# Patient Record
Sex: Female | Born: 1945
Health system: Southern US, Community
[De-identification: ages and names within clinical notes are randomized; demographics above are authoritative.]

## PROBLEM LIST (undated history)

## (undated) DIAGNOSIS — E785 Hyperlipidemia, unspecified: Secondary | ICD-10-CM

## (undated) DIAGNOSIS — C801 Malignant (primary) neoplasm, unspecified: Secondary | ICD-10-CM

## (undated) DIAGNOSIS — M8580 Other specified disorders of bone density and structure, unspecified site: Secondary | ICD-10-CM

## (undated) DIAGNOSIS — I10 Essential (primary) hypertension: Secondary | ICD-10-CM

## (undated) DIAGNOSIS — R7303 Prediabetes: Secondary | ICD-10-CM

## (undated) DIAGNOSIS — E559 Vitamin D deficiency, unspecified: Secondary | ICD-10-CM

## (undated) HISTORY — DX: Prediabetes: R73.03

## (undated) HISTORY — DX: Essential (primary) hypertension: I10

## (undated) HISTORY — DX: Other specified disorders of bone density and structure, unspecified site: M85.80

## (undated) HISTORY — DX: Hyperlipidemia, unspecified: E78.5

## (undated) HISTORY — PX: DILATION AND CURETTAGE OF UTERUS: SHX78

## (undated) HISTORY — DX: Malignant (primary) neoplasm, unspecified: C80.1

## (undated) HISTORY — DX: Vitamin D deficiency, unspecified: E55.9

## (undated) HISTORY — DX: Hypomagnesemia: E83.42

## (undated) HISTORY — PX: TONSILLECTOMY AND ADENOIDECTOMY: SHX28

---

## 1995-03-29 HISTORY — PX: BREAST LUMPECTOMY: SHX2

## 1997-09-25 ENCOUNTER — Other Ambulatory Visit: Admission: RE | Admit: 1997-09-25 | Discharge: 1997-09-25 | Payer: Self-pay | Admitting: *Deleted

## 1997-11-29 ENCOUNTER — Ambulatory Visit (HOSPITAL_COMMUNITY): Admission: RE | Admit: 1997-11-29 | Discharge: 1997-11-29 | Payer: Self-pay | Admitting: Gastroenterology

## 1998-06-30 ENCOUNTER — Ambulatory Visit (HOSPITAL_COMMUNITY): Admission: RE | Admit: 1998-06-30 | Discharge: 1998-06-30 | Payer: Self-pay | Admitting: *Deleted

## 1999-04-07 ENCOUNTER — Other Ambulatory Visit: Admission: RE | Admit: 1999-04-07 | Discharge: 1999-04-07 | Payer: Self-pay | Admitting: *Deleted

## 2000-06-09 ENCOUNTER — Other Ambulatory Visit: Admission: RE | Admit: 2000-06-09 | Discharge: 2000-06-09 | Payer: Self-pay | Admitting: *Deleted

## 2001-06-12 ENCOUNTER — Other Ambulatory Visit: Admission: RE | Admit: 2001-06-12 | Discharge: 2001-06-12 | Payer: Self-pay | Admitting: *Deleted

## 2002-06-29 ENCOUNTER — Other Ambulatory Visit: Admission: RE | Admit: 2002-06-29 | Discharge: 2002-06-29 | Payer: Self-pay | Admitting: *Deleted

## 2003-03-05 ENCOUNTER — Encounter (INDEPENDENT_AMBULATORY_CARE_PROVIDER_SITE_OTHER): Payer: Self-pay | Admitting: Specialist

## 2003-03-05 ENCOUNTER — Ambulatory Visit (HOSPITAL_COMMUNITY): Admission: RE | Admit: 2003-03-05 | Discharge: 2003-03-05 | Payer: Self-pay | Admitting: Gastroenterology

## 2003-07-26 ENCOUNTER — Other Ambulatory Visit: Admission: RE | Admit: 2003-07-26 | Discharge: 2003-07-26 | Payer: Self-pay | Admitting: *Deleted

## 2007-07-28 ENCOUNTER — Ambulatory Visit (HOSPITAL_COMMUNITY): Admission: RE | Admit: 2007-07-28 | Discharge: 2007-07-28 | Payer: Self-pay | Admitting: Internal Medicine

## 2010-11-13 ENCOUNTER — Encounter (INDEPENDENT_AMBULATORY_CARE_PROVIDER_SITE_OTHER): Payer: Self-pay | Admitting: General Surgery

## 2010-11-13 NOTE — Op Note (Signed)
Christina Rowe, Christina Rowe                           ACCOUNT NO.:  [id number]   MEDICAL RECORD NO.:  [id number]                   PATIENT TYPE:  AMB   LOCATION:  ENDO                                 FACILITY:  [facility]   PHYSICIAN:  Petra Kuba, M.D.                 DATE OF BIRTH:  [birth year]   DATE OF PROCEDURE:  03/05/2003  DATE OF DISCHARGE:                                 OPERATIVE REPORT   PROCEDURE PERFORMED:  Colonoscopy with biopsy.   ENDOSCOPIST:  Petra Kuba, M.D.   INDICATIONS FOR PROCEDURE:  Patient with family history of colon cancer, due  for colonic screening.  Consent was signed after the risks, benefits,  methods and options were thoroughly discussed in the past.   MEDICINES USED:  Demerol 80 mg, Versed 9 mg.   DESCRIPTION OF PROCEDURE:  Rectal inspection was pertinent for external  hemorrhoids, small.  Digital exam was negative.  A video pediatric  adjustable colonoscope was inserted and easily advanced to the splenic  flexure.  At that point there was a significant turn and some looping and  with rolling her on her back and some abdominal pressure, we were able to  advance through this area.  Once through this area, we were easily able to  advance to the cecum.  No abnormalities were seen on insertion.  The cecum  was identified by the appendiceal orifice and the ileocecal valve.  The  scope was slowly withdrawn.  The prep on the right side was not as good as  the left side.  It did require a moderate amount of  washing and suctioning.  There was some stool adherent to the wall of the colon. The rest of the prep  was adequate.  On slow withdrawal through the colon, the cecum, ascending  and transverse were normal.  At the approximate level of the splenic  flexure, possibly a tiny hyperplastic-appearing polyp was seen and was cold  biopsied times two.  The scope was further withdrawn.  There was an  occasional left-sided diverticulum. There was another questionable  tiny,  hyperplastic appearing mid-sigmoid polyp which was cold biopsied and put in  a separate container.  The scope was withdrawn back to the rectum.  Anorectal pullthrough and retroflexion confirmed some small hemorrhoids.  Scope was straightened and readvanced a short ways up the left side of the  colon.  Air was suctioned, scope removed.  The patient tolerated the  procedure well.  There was no immediate obvious complication.   ENDOSCOPIC DIAGNOSIS:  1. Tiny internal and external hemorrhoids.  2. Left-sided occasional diverticula.  3. Questionable two tiny sigmoid and splenic flexure probable hyperplastic     appearing polyps, cold biopsied.  4. Otherwise within normal limits to the cecum.    PLAN:  Await pathology.  Probably recheck colon screening in five years.  Happy to see back p.r.n.  Otherwise return care to Drs. Elisabeth Most and  Carey Bullocks for the customary health care maintenance to include yearly rectals  and guaiacs.                                               Petra Kuba, M.D.    MEM/MEDQ  D:  03/05/2003  T:  03/05/2003  Job:  [id number]   cc:   Lovenia Kim, D.O.  [address]  [place]  [place] [address]  Fax: [phone]   Pershing Cox, M.D.  [address]  [address]  [place]  [place] [address]  Fax: [phone]

## 2011-04-26 ENCOUNTER — Encounter (INDEPENDENT_AMBULATORY_CARE_PROVIDER_SITE_OTHER): Payer: Self-pay | Admitting: General Surgery

## 2011-06-03 ENCOUNTER — Ambulatory Visit (INDEPENDENT_AMBULATORY_CARE_PROVIDER_SITE_OTHER): Payer: Self-pay | Admitting: General Surgery

## 2011-06-03 ENCOUNTER — Encounter (INDEPENDENT_AMBULATORY_CARE_PROVIDER_SITE_OTHER): Payer: Self-pay | Admitting: General Surgery

## 2011-06-07 ENCOUNTER — Ambulatory Visit (INDEPENDENT_AMBULATORY_CARE_PROVIDER_SITE_OTHER): Payer: Medicare Other | Admitting: General Surgery

## 2011-06-07 ENCOUNTER — Encounter (INDEPENDENT_AMBULATORY_CARE_PROVIDER_SITE_OTHER): Payer: Self-pay | Admitting: General Surgery

## 2011-06-07 VITALS — BP 106/64 | HR 60 | Temp 97.0°F | Resp 16 | Ht 61.0 in | Wt 123.2 lb

## 2011-06-07 DIAGNOSIS — Z853 Personal history of malignant neoplasm of breast: Secondary | ICD-10-CM | POA: Insufficient documentation

## 2011-06-07 DIAGNOSIS — C50919 Malignant neoplasm of unspecified site of unspecified female breast: Secondary | ICD-10-CM

## 2011-06-07 NOTE — Patient Instructions (Signed)
Continue regular self exams  

## 2011-06-07 NOTE — Progress Notes (Signed)
Subjective:     Patient ID: Christina Rowe, female   DOB: June 14, 1946, 65 y.o.   MRN: [id number]  HPI The patient is a 65 year old white female who is now 15-1/2 years out from a right breast lumpectomy and axillary node dissection for a stage II right breast cancer. She underwent radiation and hormonal therapy. She is doing very well. She has no complaints today. She's not having any chest wall pain or breast pain. She does not have any discharge from the nipple.  Review of Systems  Constitutional: Negative.   HENT: Negative.   Eyes: Negative.   Respiratory: Negative.   Cardiovascular: Negative.   Gastrointestinal: Negative.   Genitourinary: Negative.   Musculoskeletal: Negative.   Skin: Negative.   Neurological: Negative.   Hematological: Negative.   Psychiatric/Behavioral: Negative.        Objective:   Physical Exam  Constitutional: She is oriented to person, place, and time. She appears well-developed and well-nourished.  HENT:  Head: Normocephalic and atraumatic.  Eyes: Conjunctivae and EOM are normal. Pupils are equal, round, and reactive to light.  Neck: Normal range of motion. Neck supple.  Cardiovascular: Normal rate, regular rhythm and normal heart sounds.   Pulmonary/Chest: Effort normal and breath sounds normal.       No palpable mass in either breast. No axillary supraclavicular or cervical lymphadenopathy.  Abdominal: Soft. Bowel sounds are normal. She exhibits no mass. There is no tenderness.  Musculoskeletal: Normal range of motion.  Lymphadenopathy:    She has no cervical adenopathy.  Neurological: She is alert and oriented to person, place, and time.  Skin: Skin is warm and dry.  Psychiatric: She has a normal mood and affect. Her behavior is normal.       Assessment:     15-1/2 years out from a right lumpectomy and axillary node dissection    Plan:     Her last mammogram done this year was at Dr. Loraine Maple office and is reported as negative. I have  encouraged her to continue regular self exams. We will plan to see her back in another year.

## 2011-06-11 ENCOUNTER — Encounter (INDEPENDENT_AMBULATORY_CARE_PROVIDER_SITE_OTHER): Payer: Self-pay

## 2011-08-09 DIAGNOSIS — H2510 Age-related nuclear cataract, unspecified eye: Secondary | ICD-10-CM | POA: Diagnosis not present

## 2011-08-12 DIAGNOSIS — Z1231 Encounter for screening mammogram for malignant neoplasm of breast: Secondary | ICD-10-CM | POA: Diagnosis not present

## 2011-08-30 ENCOUNTER — Encounter (INDEPENDENT_AMBULATORY_CARE_PROVIDER_SITE_OTHER): Payer: Self-pay | Admitting: General Surgery

## 2012-01-12 DIAGNOSIS — M899 Disorder of bone, unspecified: Secondary | ICD-10-CM | POA: Diagnosis not present

## 2012-01-12 DIAGNOSIS — M949 Disorder of cartilage, unspecified: Secondary | ICD-10-CM | POA: Diagnosis not present

## 2012-01-20 DIAGNOSIS — Z79899 Other long term (current) drug therapy: Secondary | ICD-10-CM | POA: Diagnosis not present

## 2012-01-20 DIAGNOSIS — E559 Vitamin D deficiency, unspecified: Secondary | ICD-10-CM | POA: Diagnosis not present

## 2012-01-20 DIAGNOSIS — E782 Mixed hyperlipidemia: Secondary | ICD-10-CM | POA: Diagnosis not present

## 2012-01-20 DIAGNOSIS — Z1212 Encounter for screening for malignant neoplasm of rectum: Secondary | ICD-10-CM | POA: Diagnosis not present

## 2012-01-20 DIAGNOSIS — I10 Essential (primary) hypertension: Secondary | ICD-10-CM | POA: Diagnosis not present

## 2012-04-06 DIAGNOSIS — Z01419 Encounter for gynecological examination (general) (routine) without abnormal findings: Secondary | ICD-10-CM | POA: Diagnosis not present

## 2012-04-06 DIAGNOSIS — M899 Disorder of bone, unspecified: Secondary | ICD-10-CM | POA: Diagnosis not present

## 2012-04-06 DIAGNOSIS — N951 Menopausal and female climacteric states: Secondary | ICD-10-CM | POA: Diagnosis not present

## 2012-04-06 DIAGNOSIS — Z124 Encounter for screening for malignant neoplasm of cervix: Secondary | ICD-10-CM | POA: Diagnosis not present

## 2012-04-06 DIAGNOSIS — M949 Disorder of cartilage, unspecified: Secondary | ICD-10-CM | POA: Diagnosis not present

## 2012-04-20 DIAGNOSIS — Z23 Encounter for immunization: Secondary | ICD-10-CM | POA: Diagnosis not present

## 2012-08-08 DIAGNOSIS — Z79899 Other long term (current) drug therapy: Secondary | ICD-10-CM | POA: Diagnosis not present

## 2012-08-08 DIAGNOSIS — I10 Essential (primary) hypertension: Secondary | ICD-10-CM | POA: Diagnosis not present

## 2012-08-08 DIAGNOSIS — R7309 Other abnormal glucose: Secondary | ICD-10-CM | POA: Diagnosis not present

## 2012-08-08 DIAGNOSIS — E559 Vitamin D deficiency, unspecified: Secondary | ICD-10-CM | POA: Diagnosis not present

## 2012-08-08 DIAGNOSIS — E782 Mixed hyperlipidemia: Secondary | ICD-10-CM | POA: Diagnosis not present

## 2012-08-17 ENCOUNTER — Telehealth (INDEPENDENT_AMBULATORY_CARE_PROVIDER_SITE_OTHER): Payer: Self-pay

## 2012-08-17 DIAGNOSIS — Z1231 Encounter for screening mammogram for malignant neoplasm of breast: Secondary | ICD-10-CM | POA: Diagnosis not present

## 2012-08-17 NOTE — Telephone Encounter (Signed)
LMOM to call back. She has appt on 2/24 and I don't have recent MM report. If she has had one I need a copy faxed over or she needs to bring one with her.

## 2012-08-18 NOTE — Telephone Encounter (Signed)
The pt called back.  She just had her mammogram yesterday at [facility].  The report is in your box.

## 2012-08-21 ENCOUNTER — Ambulatory Visit (INDEPENDENT_AMBULATORY_CARE_PROVIDER_SITE_OTHER): Payer: Medicare Other | Admitting: General Surgery

## 2012-08-21 ENCOUNTER — Encounter (INDEPENDENT_AMBULATORY_CARE_PROVIDER_SITE_OTHER): Payer: Self-pay | Admitting: General Surgery

## 2012-08-21 VITALS — BP 118/60 | HR 92 | Temp 97.7°F | Resp 18 | Ht 60.0 in | Wt 124.4 lb

## 2012-08-21 DIAGNOSIS — C50919 Malignant neoplasm of unspecified site of unspecified female breast: Secondary | ICD-10-CM

## 2012-08-21 DIAGNOSIS — C50911 Malignant neoplasm of unspecified site of right female breast: Secondary | ICD-10-CM

## 2012-08-21 NOTE — Patient Instructions (Signed)
Continue regular self exams  

## 2012-08-21 NOTE — Progress Notes (Signed)
Subjective:     Patient ID: Christina Rowe, female   DOB: [birth year], 67 y.o.   MRN: [id number]  HPI The patient is a 67 year old white female who is 16-1/2 years out from a right lumpectomy and axillary lymph node dissection for breast cancer. Since her last visit she has been well. She denies any major medical problems. She denies any breast pain. She denies any swelling in her right arm. Her most recent mammogram in the last couple weeks showed no evidence of malignancy.  Review of Systems  Constitutional: Negative.   HENT: Negative.   Eyes: Negative.   Respiratory: Negative.   Cardiovascular: Negative.   Gastrointestinal: Negative.   Endocrine: Negative.   Genitourinary: Negative.   Musculoskeletal: Negative.   Skin: Negative.   Allergic/Immunologic: Negative.   Neurological: Negative.   Hematological: Negative.   Psychiatric/Behavioral: Negative.        Objective:   Physical Exam  Constitutional: She is oriented to person, place, and time. She appears well-developed and well-nourished.  HENT:  Head: Normocephalic and atraumatic.  Eyes: Conjunctivae and EOM are normal. Pupils are equal, round, and reactive to light.  Neck: Normal range of motion. Neck supple.  Cardiovascular: Normal rate, regular rhythm and normal heart sounds.   Pulmonary/Chest: Effort normal and breath sounds normal.  There is no palpable mass in either breast. There is no palpable axillary or supraclavicular cervical lymphadenopathy.  Abdominal: Soft. Bowel sounds are normal. She exhibits no mass. There is no tenderness.  Musculoskeletal: Normal range of motion.  Lymphadenopathy:    She has no cervical adenopathy.  Neurological: She is alert and oriented to person, place, and time.  Skin: Skin is warm and dry.  Psychiatric: She has a normal mood and affect. Her behavior is normal.       Assessment:     The patient is 16-1/2 years out from a right lumpectomy and axillary lymph node dissection for  breast cancer     Plan:     At this point she will continue to do regular self exams. We will plan to see her back in one year

## 2012-09-28 ENCOUNTER — Encounter (INDEPENDENT_AMBULATORY_CARE_PROVIDER_SITE_OTHER): Payer: Self-pay

## 2012-10-19 DIAGNOSIS — H2510 Age-related nuclear cataract, unspecified eye: Secondary | ICD-10-CM | POA: Diagnosis not present

## 2012-11-15 DIAGNOSIS — E782 Mixed hyperlipidemia: Secondary | ICD-10-CM | POA: Diagnosis not present

## 2012-11-15 DIAGNOSIS — Z79899 Other long term (current) drug therapy: Secondary | ICD-10-CM | POA: Diagnosis not present

## 2012-11-15 DIAGNOSIS — I10 Essential (primary) hypertension: Secondary | ICD-10-CM | POA: Diagnosis not present

## 2012-11-15 DIAGNOSIS — E559 Vitamin D deficiency, unspecified: Secondary | ICD-10-CM | POA: Diagnosis not present

## 2012-11-15 DIAGNOSIS — R7309 Other abnormal glucose: Secondary | ICD-10-CM | POA: Diagnosis not present

## 2013-02-01 DIAGNOSIS — E559 Vitamin D deficiency, unspecified: Secondary | ICD-10-CM | POA: Diagnosis not present

## 2013-02-01 DIAGNOSIS — E782 Mixed hyperlipidemia: Secondary | ICD-10-CM | POA: Diagnosis not present

## 2013-02-01 DIAGNOSIS — Z79899 Other long term (current) drug therapy: Secondary | ICD-10-CM | POA: Diagnosis not present

## 2013-02-01 DIAGNOSIS — Z1212 Encounter for screening for malignant neoplasm of rectum: Secondary | ICD-10-CM | POA: Diagnosis not present

## 2013-02-01 DIAGNOSIS — I10 Essential (primary) hypertension: Secondary | ICD-10-CM | POA: Diagnosis not present

## 2013-02-28 DIAGNOSIS — D485 Neoplasm of uncertain behavior of skin: Secondary | ICD-10-CM | POA: Diagnosis not present

## 2013-02-28 DIAGNOSIS — D0460 Carcinoma in situ of skin of unspecified upper limb, including shoulder: Secondary | ICD-10-CM | POA: Diagnosis not present

## 2013-02-28 DIAGNOSIS — L820 Inflamed seborrheic keratosis: Secondary | ICD-10-CM | POA: Diagnosis not present

## 2013-04-28 DIAGNOSIS — Z23 Encounter for immunization: Secondary | ICD-10-CM | POA: Diagnosis not present

## 2013-05-07 ENCOUNTER — Encounter: Payer: Self-pay | Admitting: Internal Medicine

## 2013-05-07 DIAGNOSIS — I10 Essential (primary) hypertension: Secondary | ICD-10-CM

## 2013-05-07 DIAGNOSIS — E559 Vitamin D deficiency, unspecified: Secondary | ICD-10-CM

## 2013-05-07 DIAGNOSIS — E785 Hyperlipidemia, unspecified: Secondary | ICD-10-CM

## 2013-05-07 DIAGNOSIS — E782 Mixed hyperlipidemia: Secondary | ICD-10-CM | POA: Insufficient documentation

## 2013-05-07 DIAGNOSIS — R030 Elevated blood-pressure reading, without diagnosis of hypertension: Secondary | ICD-10-CM | POA: Insufficient documentation

## 2013-05-07 DIAGNOSIS — R7303 Prediabetes: Secondary | ICD-10-CM | POA: Insufficient documentation

## 2013-05-08 ENCOUNTER — Encounter: Payer: Self-pay | Admitting: Physician Assistant

## 2013-05-08 ENCOUNTER — Ambulatory Visit: Payer: Medicare Other | Admitting: Physician Assistant

## 2013-05-08 VITALS — BP 122/70 | HR 56 | Temp 97.7°F | Resp 16 | Wt 124.0 lb

## 2013-05-08 DIAGNOSIS — E785 Hyperlipidemia, unspecified: Secondary | ICD-10-CM

## 2013-05-08 DIAGNOSIS — E559 Vitamin D deficiency, unspecified: Secondary | ICD-10-CM

## 2013-05-08 DIAGNOSIS — I10 Essential (primary) hypertension: Secondary | ICD-10-CM

## 2013-05-08 DIAGNOSIS — E782 Mixed hyperlipidemia: Secondary | ICD-10-CM | POA: Diagnosis not present

## 2013-05-08 DIAGNOSIS — R7303 Prediabetes: Secondary | ICD-10-CM

## 2013-05-08 DIAGNOSIS — L909 Atrophic disorder of skin, unspecified: Secondary | ICD-10-CM | POA: Diagnosis not present

## 2013-05-08 DIAGNOSIS — L918 Other hypertrophic disorders of the skin: Secondary | ICD-10-CM

## 2013-05-08 HISTORY — DX: Hyperlipidemia, unspecified: E78.5

## 2013-05-08 HISTORY — DX: Hypomagnesemia: E83.42

## 2013-05-08 LAB — CBC WITH DIFFERENTIAL/PLATELET
Basophils Relative: 1 % (ref 0–1)
Eosinophils Absolute: 0.1 10*3/uL (ref 0.0–0.7)
Eosinophils Relative: 1 % (ref 0–5)
Lymphs Abs: 2.2 10*3/uL (ref 0.7–4.0)
MCH: 31.3 pg (ref 26.0–34.0)
MCHC: 33.9 g/dL (ref 30.0–36.0)
MCV: 92.3 fL (ref 78.0–100.0)
Neutrophils Relative %: 58 % (ref 43–77)
Platelets: 240 10*3/uL (ref 150–400)

## 2013-05-08 NOTE — Patient Instructions (Signed)
Wound Infection  A wound infection happens when a type of germ (bacteria) starts growing in the wound. In some cases, this can cause the wound to break open. If cared for properly, the infected wound will heal from the inside to the outside. Wound infections need treatment.  CAUSES  An infection is caused by bacteria growing in the wound.   SYMPTOMS    Increase in redness, swelling, or pain at the wound site.   Increase in drainage at the wound site.   Wound or bandage (dressing) starts to smell bad.   Fever.   Feeling tired or fatigued.   Pus draining from the wound.  TREATMENT   You caregiver will prescribe antibiotic medicine. The wound infection should improve within 24 to 48 hours. Any redness around the wound should stop spreading and the wound should be less painful.   HOME CARE INSTRUCTIONS    Only take over-the-counter or prescription medicines for pain, discomfort, or fever as directed by your caregiver.   Take your antibiotics as directed. Finish them even if you start to feel better.   Gently wash the area with mild soap and water 2 times a day, or as directed. Rinse off the soap. Pat the area dry with a clean towel. Do not rub the wound. This may cause bleeding.   Follow your caregiver's instructions for how often you need to change the dressing.   Apply ointment and a dressing to the wound as directed.   If the dressing sticks, moisten it with soapy water and gently remove it.   Change the bandage right away if it becomes wet, dirty, or develops a bad smell.   Take showers. Do not take tub baths, swim, or do anything that may soak the wound until it is healed.   Avoid exercises that make you sweat heavily.   Use anti-itch medicine as directed by your caregiver. The wound may itch when it is healing. Do not pick or scratch at the wound.   Follow up with your caregiver to get your wound rechecked as directed.  SEEK MEDICAL CARE IF:   You have an increase in swelling, pain, or redness  around the wound.   You have an increase in the amount of pus coming from the wound.   There is a bad smell coming from the wound.   More of the wound breaks open.   You have a fever.  MAKE SURE YOU:    Understand these instructions.   Will watch your condition.   Will get help right away if you are not doing well or get worse.  Document Released: 03/13/2003 Document Revised: 09/06/2011 Document Reviewed: 10/18/2010  Exit[employer] Patient Information 2014 [employer].

## 2013-05-08 NOTE — Progress Notes (Signed)
HPI Patient presents for 3 month follow up with hypertension, hyperlipidemia, prediabetes and vitamin D.  Patient's blood pressure has been controlled at home. Patient denies chest pain, shortness of breath, dizziness.  Patient's cholesterol is diet controlled and is on Lipitor and denies myalgias. The cholesterol last visit was 94 (135). Patient is on Vitamin D supplement.  Current Medications:    Medication List       This list is accurate as of: 05/08/13  4:33 PM.  Always use your most recent med list.               aspirin 81 MG tablet  Take 81 mg by mouth daily.     atorvastatin 80 MG tablet  Commonly known as:  LIPITOR  Take 80 mg by mouth daily.     CALCIUM 1200 PO  Take by mouth.     Fish Oil 1200 MG Caps  Take by mouth.     Flax Seed Oil 1000 MG Caps  Take by mouth.     Magnesium 250 MG Tabs  Take 250 mg by mouth daily.     MULTIVITAMIN PO  Take by mouth.     VITAMIN D (CHOLECALCIFEROL) PO  Take 5,000 mg by mouth daily.       Allergies:  Allergies  Allergen Reactions  . Ppd [Tuberculin Purified Protein Derivative]     ROS Constitutional: Denies fever, chills, weight loss/gain, headaches, insomnia, fatigue, night sweats, and change in appetite. Eyes: Denies redness, blurred vision, diplopia, discharge, itchy, watery eyes.  ENT: Denies discharge, congestion, post nasal drip, sore throat, earache, dental pain, Tinnitus, Vertigo, Sinus pain, snoring.  Cardio: Denies chest pain, palpitations, irregular heartbeat,  dyspnea, diaphoresis, orthopnea, PND, claudication, edema Respiratory: denies cough, dyspnea,pleurisy, hoarseness, wheezing.  Gastrointestinal: Denies dysphagia, heartburn,  water brash, pain, cramps, nausea, vomiting, bloating, diarrhea, constipation, hematemesis, melena, hematochezia,  hemorrhoids Genitourinary: Denies dysuria, frequency, urgency, nocturia, hesitancy, discharge, hematuria, flank pain Musculoskeletal: Denies arthralgia, myalgia,  stiffness, Jt. Swelling, pain, limp, and strain/sprain. Skin: Denies puritis, rash, hives, warts, acne, eczema, changing in skin lesion Neuro: Weakness, tremor, incoordination, spasms, paresthesia, pain Psychiatric: Denies confusion, memory loss, sensory loss Endocrine: Denies change in weight, skin, hair change, nocturia, and paresthesia, Diabetic Polys, visual blurring, hyper /hypo glycemic episodes.  Heme/Lymph: Excessive bleeding, bruising, enlarged lymph nodes Medical History:  Past Medical History  Diagnosis Date  . Hyperlipidemia   . Cancer     breast  . Hypertension   . Prediabetes   . Osteopenia   . Vitamin D deficiency   . Hyperlipemia 05/08/2013  . Hypomagnesemia 05/08/2013   Family history- Review and unchanged Social history- Review and unchanged Physical Exam: Filed Vitals:   05/08/13 1613  BP: 122/70  Pulse: 56  Temp: 97.7 F (36.5 C)  Resp: 16   Filed Weights   05/08/13 1613  Weight: 124 lb (56.246 kg)   General Appearance: Well nourished, in no apparent distress. Eyes: PERRLA, EOMs, conjunctiva no swelling or erythema, normal fundi and vessels. Sinuses: No Frontal/maxillary tenderness ENT/Mouth: Ext aud canals clear, with TMs without erythema, bulging.No erythema, swelling, or exudate on post pharynx.  Tonsils not swollen or erythematous. Hearing normal.  Neck: Supple, thyroid normal.  Respiratory: Respiratory effort normal, BS equal bilaterally without rales, rhonci, wheezing or stridor.  Cardio: Heart sounds normal, regular rate and rhythm without murmurs, rubs or gallops. Peripheral pulses brisk and equal bilaterally, without edema.  Abdomen: Flat, soft, with bowel sounds. Nontender, no guarding, rebound, hernias, masses, or  organomegaly.  Lymphatics: Non tender without lymphadenopathy.  Musculoskeletal: Full ROM all peripheral extremities, joint stability, 5/5 strength, and normal gait. Skin: Warm, dry without rashes, lesions, ecchymosis. Has right  large skin tag on right neck. Neuro: Cranial nerves intact, reflexes equal bilaterally. Normal muscle tone, no cerebellar symptoms. Sensation intact.  Pysch: Awake and oriented X 3, normal affect, Insight and Judgment appropriate.   Assessement and Plan:  1. Hypertension At goal- continue meds.   2. Prediabetes Has been controlled for a long period of a time- will check A1C at CPE otherwise continue diet/exercise.   3. Hyperlipidemia Continue medications and check cholesterol  4. Vitamin D deficiency Continue Vitamin D and check level  5. Hypomagnesemia Continue meds  6. Skin tag Removed. Area cleaned with alcohol, and 0.5cc of Lidocaine used to numb the area, then electrocautery used. No bleeding. Tolerated well.     Christina Rowe 4:33 PM

## 2013-05-09 LAB — MAGNESIUM: Magnesium: 2.3 mg/dL (ref 1.5–2.5)

## 2013-05-09 LAB — HEPATIC FUNCTION PANEL
ALT: 20 U/L (ref 0–35)
AST: 22 U/L (ref 0–37)
Albumin: 4.2 g/dL (ref 3.5–5.2)
Bilirubin, Direct: 0.1 mg/dL (ref 0.0–0.3)
Total Bilirubin: 0.6 mg/dL (ref 0.3–1.2)

## 2013-05-09 LAB — BASIC METABOLIC PANEL WITH GFR
CO2: 27 mEq/L (ref 19–32)
Calcium: 9.9 mg/dL (ref 8.4–10.5)
Creat: 0.86 mg/dL (ref 0.50–1.10)
GFR, Est African American: 80 mL/min
Glucose, Bld: 71 mg/dL (ref 70–99)
Sodium: 143 mEq/L (ref 135–145)

## 2013-05-09 LAB — LIPID PANEL
Cholesterol: 171 mg/dL (ref 0–200)
Total CHOL/HDL Ratio: 4.5 Ratio

## 2013-05-09 LAB — TSH: TSH: 1.705 u[IU]/mL (ref 0.350–4.500)

## 2013-05-17 DIAGNOSIS — L821 Other seborrheic keratosis: Secondary | ICD-10-CM | POA: Diagnosis not present

## 2013-05-17 DIAGNOSIS — L570 Actinic keratosis: Secondary | ICD-10-CM | POA: Diagnosis not present

## 2013-05-17 DIAGNOSIS — D1801 Hemangioma of skin and subcutaneous tissue: Secondary | ICD-10-CM | POA: Diagnosis not present

## 2013-05-17 DIAGNOSIS — Z85828 Personal history of other malignant neoplasm of skin: Secondary | ICD-10-CM | POA: Diagnosis not present

## 2013-07-17 ENCOUNTER — Encounter (INDEPENDENT_AMBULATORY_CARE_PROVIDER_SITE_OTHER): Payer: Self-pay | Admitting: General Surgery

## 2013-08-13 NOTE — Progress Notes (Signed)
Patient ID: Christina Rowe, female   DOB: 1946/06/11, 68 y.o.   MRN: [id number]

## 2013-08-14 ENCOUNTER — Ambulatory Visit: Payer: Medicare Other | Admitting: Internal Medicine

## 2013-08-20 ENCOUNTER — Ambulatory Visit: Payer: Self-pay | Admitting: Internal Medicine

## 2013-08-20 DIAGNOSIS — Z1231 Encounter for screening mammogram for malignant neoplasm of breast: Secondary | ICD-10-CM | POA: Diagnosis not present

## 2013-08-27 ENCOUNTER — Ambulatory Visit (INDEPENDENT_AMBULATORY_CARE_PROVIDER_SITE_OTHER): Payer: Medicare Other | Admitting: General Surgery

## 2013-08-27 ENCOUNTER — Encounter (INDEPENDENT_AMBULATORY_CARE_PROVIDER_SITE_OTHER): Payer: Self-pay | Admitting: General Surgery

## 2013-08-27 VITALS — BP 104/70 | HR 66 | Temp 97.6°F | Resp 16 | Ht 60.5 in | Wt 126.6 lb

## 2013-08-27 DIAGNOSIS — C50919 Malignant neoplasm of unspecified site of unspecified female breast: Secondary | ICD-10-CM

## 2013-08-27 NOTE — Progress Notes (Signed)
Subjective:     Patient ID: Christina Rowe, female   DOB: 13-Jan-1946, 68 y.o.   MRN: [id number]  HPI The patient is a 68 year old white female who is 17-1/2 years status post right lumpectomy and axillary lymph node dissection for breast cancer. She has been well for the last year. She has not had any major medical illnesses. She denies any breast pain. She did have a mammogram recently that showed no evidence of malignancy by her report.  Review of Systems  Constitutional: Negative.   HENT: Negative.   Eyes: Negative.   Respiratory: Negative.   Cardiovascular: Negative.   Gastrointestinal: Negative.   Endocrine: Negative.   Genitourinary: Negative.   Musculoskeletal: Negative.   Skin: Negative.   Allergic/Immunologic: Negative.   Neurological: Negative.   Hematological: Negative.   Psychiatric/Behavioral: Negative.        Objective:   Physical Exam  Constitutional: She is oriented to person, place, and time. She appears well-developed and well-nourished.  HENT:  Head: Normocephalic and atraumatic.  Eyes: Conjunctivae and EOM are normal. Pupils are equal, round, and reactive to light.  Neck: Normal range of motion. Neck supple.  Cardiovascular: Normal rate, regular rhythm and normal heart sounds.   Pulmonary/Chest: Effort normal and breath sounds normal.  There is some retracted scar laterally in the right breast. Otherwise there is no other palpable mass in either breast. There is no palpable axillary, supraclavicular, or cervical lymphadenopathy.  Abdominal: Soft. Bowel sounds are normal.  Musculoskeletal: Normal range of motion.  Lymphadenopathy:    She has no cervical adenopathy.  Neurological: She is alert and oriented to person, place, and time.  Skin: Skin is warm and dry.  Psychiatric: She has a normal mood and affect. Her behavior is normal.       Assessment:     The patient is 17-1/2 years status post right lumpectomy and axillary lymph node dissection for right  breast cancer     Plan:     At this point she will continue to do regular self exams. I will plan to see her back in one year.

## 2013-08-27 NOTE — Patient Instructions (Signed)
Continue regular self exams  

## 2013-08-28 ENCOUNTER — Encounter (INDEPENDENT_AMBULATORY_CARE_PROVIDER_SITE_OTHER): Payer: Self-pay

## 2013-08-30 ENCOUNTER — Ambulatory Visit (INDEPENDENT_AMBULATORY_CARE_PROVIDER_SITE_OTHER): Payer: Medicare Other | Admitting: Internal Medicine

## 2013-08-30 ENCOUNTER — Encounter: Payer: Self-pay | Admitting: Internal Medicine

## 2013-08-30 VITALS — BP 110/68 | HR 64 | Temp 97.3°F | Resp 16 | Ht 60.25 in | Wt 126.4 lb

## 2013-08-30 DIAGNOSIS — E559 Vitamin D deficiency, unspecified: Secondary | ICD-10-CM | POA: Diagnosis not present

## 2013-08-30 DIAGNOSIS — R7309 Other abnormal glucose: Secondary | ICD-10-CM

## 2013-08-30 DIAGNOSIS — R7303 Prediabetes: Secondary | ICD-10-CM

## 2013-08-30 DIAGNOSIS — E785 Hyperlipidemia, unspecified: Secondary | ICD-10-CM | POA: Diagnosis not present

## 2013-08-30 DIAGNOSIS — I10 Essential (primary) hypertension: Secondary | ICD-10-CM

## 2013-08-30 DIAGNOSIS — Z79899 Other long term (current) drug therapy: Secondary | ICD-10-CM | POA: Diagnosis not present

## 2013-08-30 LAB — CBC WITH DIFFERENTIAL/PLATELET
BASOS ABS: 0 10*3/uL (ref 0.0–0.1)
BASOS PCT: 0 % (ref 0–1)
EOS ABS: 0.1 10*3/uL (ref 0.0–0.7)
Eosinophils Relative: 1 % (ref 0–5)
HCT: 44 % (ref 36.0–46.0)
Hemoglobin: 15 g/dL (ref 12.0–15.0)
Lymphocytes Relative: 38 % (ref 12–46)
Lymphs Abs: 2.1 10*3/uL (ref 0.7–4.0)
MCH: 31.4 pg (ref 26.0–34.0)
MCHC: 34.1 g/dL (ref 30.0–36.0)
MCV: 92.2 fL (ref 78.0–100.0)
Monocytes Absolute: 0.3 10*3/uL (ref 0.1–1.0)
Monocytes Relative: 6 % (ref 3–12)
NEUTROS PCT: 55 % (ref 43–77)
Neutro Abs: 3.1 10*3/uL (ref 1.7–7.7)
PLATELETS: 229 10*3/uL (ref 150–400)
RBC: 4.77 MIL/uL (ref 3.87–5.11)
RDW: 13.7 % (ref 11.5–15.5)
WBC: 5.6 10*3/uL (ref 4.0–10.5)

## 2013-08-30 NOTE — Patient Instructions (Signed)

## 2013-08-30 NOTE — Progress Notes (Signed)
Patient ID: Christina Rowe, female   DOB: Apr 08, 1946, 68 y.o.   MRN: [id number]    This very nice 68 y.o. DWF presents for 3 month follow up with Labile Hypertension, Hyperlipidemia, Pre-Diabetes and Vitamin D Deficiency.    Monitoring BP's over the last several years have been normal. Today's BP: 110/68 mmHg . Patient denies any cardiac type chest pain, palpitations, dyspnea/orthopnea/PND, dizziness, claudication, or dependent edema.   Hyperlipidemia is controlled with diet & meds. Last Lipids as below are near goal. Patient denies myalgias or other med SE's.  Lab Results  Component Value Date   CHOL 171 05/08/2013   HDL 38* 05/08/2013   LDLCALC 111* 05/08/2013   TRIG 108 05/08/2013   CHOLHDL 4.5 05/08/2013      Also, the patient is screened for  PreDiabetes/insulin resistance with Hx/o abnormal glucose     with last A1c of 5.5% in May 2014. Patient denies any symptoms of reactive hypoglycemia, diabetic polys, paresthesias or visual blurring.     Further, Patient has history of Vitamin D Deficiency with last vitamin D of 55 in May 2014. Patient supplements vitamin D without any suspected side-effects.    Medication List       aspirin 81 MG tablet  Take 81 mg by mouth daily.     atorvastatin 80 MG tablet  Commonly known as:  LIPITOR  Take 80 mg by mouth daily.     CALCIUM 1200 PO  Take by mouth.     Fish Oil 1200 MG Caps  Take by mouth.     Flax Seed Oil 1000 MG Caps  Take by mouth.     Magnesium 250 MG Tabs  Take 250 mg by mouth daily.     MULTIVITAMIN PO  Take by mouth.     VITAMIN D (CHOLECALCIFEROL) PO  Take 5,000 mg by mouth daily.         Allergies  Allergen Reactions  . Ppd [Tuberculin Purified Protein Derivative]     PMHx:   Past Medical History  Diagnosis Date  . Hyperlipidemia   . Cancer     breast  . Hypertension   . Prediabetes   . Osteopenia   . Vitamin D deficiency   . Hyperlipemia 05/08/2013  . Hypomagnesemia 05/08/2013    FHx:     Reviewed / unchanged  SHx:    Reviewed / unchanged  Systems Review: Constitutional: Denies fever, chills, wt changes, headaches, insomnia, fatigue, night sweats, change in appetite. Eyes: Denies redness, blurred vision, diplopia, discharge, itchy, watery eyes.  ENT: Denies discharge, congestion, post nasal drip, epistaxis, sore throat, earache, hearing loss, dental pain, tinnitus, vertigo, sinus pain, snoring.  CV: Denies chest pain, palpitations, irregular heartbeat, syncope, dyspnea, diaphoresis, orthopnea, PND, claudication, edema. Respiratory: denies cough, dyspnea, DOE, pleurisy, hoarseness, laryngitis, wheezing.  Gastrointestinal: Denies dysphagia, odynophagia, heartburn, reflux, water brash, abdominal pain or cramps, nausea, vomiting, bloating, diarrhea, constipation, hematemesis, melena, hematochezia,  or hemorrhoids. Genitourinary: Denies dysuria, frequency, urgency, nocturia, hesitancy, discharge, hematuria, flank pain. Musculoskeletal: Denies arthralgias, myalgias, stiffness, jt. swelling, pain, limp, strain/sprain.  Skin: Denies pruritus, rash, hives, warts, acne, eczema, change in skin lesion(s). Neuro: No weakness, tremor, incoordination, spasms, paresthesia, or pain. Psychiatric: Denies confusion, memory loss, or sensory loss. Endo: Denies change in weight, skin, hair change.  Heme/Lymph: No excessive bleeding, bruising, orenlarged lymph nodes.  BP: 110/68  Pulse: 64  Temp: 97.3 F (36.3 C)  Resp: 16    Estimated body mass index is 24.49 kg/(m^2) as calculated  from the following:   Height as of this encounter: 5' 0.25" (1.53 m).   Weight as of this encounter: 126 lb 6.4 oz (57.335 kg).  On Exam: Appears well nourished - in no distress. Eyes: PERRLA, EOMs, conjunctiva no swelling or erythema. Sinuses: No frontal/maxillary tenderness ENT/Mouth: EAC's clear, TM's nl w/o erythema, bulging. Nares clear w/o erythema, swelling, exudates. Oropharynx clear without erythema or  exudates. Oral hygiene is good. Tongue normal, non obstructing. Hearing intact.  Neck: Supple. Thyroid nl. Car 2+/2+ without bruits, nodes or JVD. Chest: Respirations nl with BS clear & equal w/o rales, rhonchi, wheezing or stridor.  Cor: Heart sounds normal w/ regular rate and rhythm without sig. murmurs, gallops, clicks, or rubs. Peripheral pulses normal and equal  without edema.  Abdomen: Soft & bowel sounds normal. Non-tender w/o guarding, rebound, hernias, masses, or organomegaly.  Lymphatics: Unremarkable.  Musculoskeletal: Full ROM all peripheral extremities, joint stability, 5/5 strength, and normal gait.  Skin: Warm, dry without exposed rashes, lesions, ecchymosis apparent.  Neuro: Cranial nerves intact, reflexes equal bilaterally. Sensory-motor testing grossly intact. Tendon reflexes grossly intact.  Pysch: Alert & oriented x 3. Insight and judgement nl & appropriate. No ideations.  Assessment and Plan:  1. Hypertension - Continue monitor blood pressure at home. Continue diet/meds same.  2. Hyperlipidemia - Continue diet/meds, exercise,& lifestyle modifications. Continue monitor periodic cholesterol/liver & renal functions   3. Pre-diabetes/Insulin Resistance, screening - Continue diet, exercise, lifestyle modifications. Monitor appropriate labs.  4. Vitamin D Deficiency - Continue supplementation.  Recommended regular exercise, BP monitoring, weight control, and discussed med and SE's. Recommended labs to assess and monitor clinical status. Further disposition pending results of labs.

## 2013-08-31 LAB — LIPID PANEL
CHOL/HDL RATIO: 3.9 ratio
CHOLESTEROL: 142 mg/dL (ref 0–200)
HDL: 36 mg/dL — AB (ref >39)
LDL Cholesterol: 77 mg/dL (ref 0–99)
Triglycerides: 143 mg/dL (ref <150)
VLDL: 29 mg/dL (ref 0–40)

## 2013-08-31 LAB — HEPATIC FUNCTION PANEL
ALBUMIN: 4.4 g/dL (ref 3.5–5.2)
ALT: 19 U/L (ref 0–35)
AST: 20 U/L (ref 0–37)
Alkaline Phosphatase: 60 U/L (ref 39–117)
BILIRUBIN DIRECT: 0.1 mg/dL (ref 0.0–0.3)
BILIRUBIN TOTAL: 0.4 mg/dL (ref 0.2–1.2)
Indirect Bilirubin: 0.3 mg/dL (ref 0.2–1.2)
Total Protein: 7 g/dL (ref 6.0–8.3)

## 2013-08-31 LAB — BASIC METABOLIC PANEL WITH GFR
BUN: 16 mg/dL (ref 6–23)
CALCIUM: 9.4 mg/dL (ref 8.4–10.5)
CO2: 26 mEq/L (ref 19–32)
Chloride: 105 mEq/L (ref 96–112)
Creat: 0.76 mg/dL (ref 0.50–1.10)
GFR, Est Non African American: 80 mL/min
Glucose, Bld: 81 mg/dL (ref 70–99)
Potassium: 4.2 mEq/L (ref 3.5–5.3)
SODIUM: 139 meq/L (ref 135–145)

## 2013-08-31 LAB — HEMOGLOBIN A1C
HEMOGLOBIN A1C: 5.5 % (ref <5.7)
Mean Plasma Glucose: 111 mg/dL (ref <117)

## 2013-08-31 LAB — INSULIN, FASTING: INSULIN FASTING, SERUM: 9 u[IU]/mL (ref 3–28)

## 2013-08-31 LAB — MAGNESIUM: MAGNESIUM: 2.2 mg/dL (ref 1.5–2.5)

## 2013-08-31 LAB — VITAMIN D 25 HYDROXY (VIT D DEFICIENCY, FRACTURES): VIT D 25 HYDROXY: 78 ng/mL (ref 30–89)

## 2013-08-31 LAB — TSH: TSH: 1.688 u[IU]/mL (ref 0.350–4.500)

## 2013-11-12 DIAGNOSIS — H2510 Age-related nuclear cataract, unspecified eye: Secondary | ICD-10-CM | POA: Diagnosis not present

## 2013-11-20 DIAGNOSIS — L738 Other specified follicular disorders: Secondary | ICD-10-CM | POA: Diagnosis not present

## 2013-11-20 DIAGNOSIS — D1801 Hemangioma of skin and subcutaneous tissue: Secondary | ICD-10-CM | POA: Diagnosis not present

## 2013-11-20 DIAGNOSIS — L821 Other seborrheic keratosis: Secondary | ICD-10-CM | POA: Diagnosis not present

## 2013-11-20 DIAGNOSIS — Z85828 Personal history of other malignant neoplasm of skin: Secondary | ICD-10-CM | POA: Diagnosis not present

## 2013-11-20 DIAGNOSIS — L820 Inflamed seborrheic keratosis: Secondary | ICD-10-CM | POA: Diagnosis not present

## 2013-11-20 DIAGNOSIS — L819 Disorder of pigmentation, unspecified: Secondary | ICD-10-CM | POA: Diagnosis not present

## 2013-11-20 DIAGNOSIS — L678 Other hair color and hair shaft abnormalities: Secondary | ICD-10-CM | POA: Diagnosis not present

## 2013-12-03 ENCOUNTER — Encounter: Payer: Self-pay | Admitting: Emergency Medicine

## 2013-12-03 ENCOUNTER — Ambulatory Visit (INDEPENDENT_AMBULATORY_CARE_PROVIDER_SITE_OTHER): Payer: Medicare Other | Admitting: Emergency Medicine

## 2013-12-03 VITALS — BP 104/66 | HR 68 | Temp 98.0°F | Resp 16 | Ht 60.25 in | Wt 130.0 lb

## 2013-12-03 DIAGNOSIS — Z79899 Other long term (current) drug therapy: Secondary | ICD-10-CM | POA: Diagnosis not present

## 2013-12-03 DIAGNOSIS — E782 Mixed hyperlipidemia: Secondary | ICD-10-CM | POA: Diagnosis not present

## 2013-12-03 LAB — CBC WITH DIFFERENTIAL/PLATELET
BASOS ABS: 0 10*3/uL (ref 0.0–0.1)
BASOS PCT: 0 % (ref 0–1)
EOS ABS: 0 10*3/uL (ref 0.0–0.7)
EOS PCT: 1 % (ref 0–5)
HCT: 43.1 % (ref 36.0–46.0)
Hemoglobin: 14.8 g/dL (ref 12.0–15.0)
Lymphocytes Relative: 34 % (ref 12–46)
Lymphs Abs: 1.6 10*3/uL (ref 0.7–4.0)
MCH: 31.7 pg (ref 26.0–34.0)
MCHC: 34.3 g/dL (ref 30.0–36.0)
MCV: 92.3 fL (ref 78.0–100.0)
Monocytes Absolute: 0.4 10*3/uL (ref 0.1–1.0)
Monocytes Relative: 8 % (ref 3–12)
NEUTROS PCT: 57 % (ref 43–77)
Neutro Abs: 2.7 10*3/uL (ref 1.7–7.7)
PLATELETS: 230 10*3/uL (ref 150–400)
RBC: 4.67 MIL/uL (ref 3.87–5.11)
RDW: 13.5 % (ref 11.5–15.5)
WBC: 4.7 10*3/uL (ref 4.0–10.5)

## 2013-12-03 LAB — LIPID PANEL
CHOL/HDL RATIO: 4.2 ratio
Cholesterol: 167 mg/dL (ref 0–200)
HDL: 40 mg/dL (ref >39)
LDL Cholesterol: 102 mg/dL — ABNORMAL HIGH (ref 0–99)
TRIGLYCERIDES: 126 mg/dL (ref <150)
VLDL: 25 mg/dL (ref 0–40)

## 2013-12-03 LAB — HEPATIC FUNCTION PANEL
ALBUMIN: 4.2 g/dL (ref 3.5–5.2)
ALK PHOS: 59 U/L (ref 39–117)
ALT: 26 U/L (ref 0–35)
AST: 26 U/L (ref 0–37)
BILIRUBIN TOTAL: 0.5 mg/dL (ref 0.2–1.2)
Bilirubin, Direct: 0.1 mg/dL (ref 0.0–0.3)
Indirect Bilirubin: 0.4 mg/dL (ref 0.2–1.2)
Total Protein: 6.7 g/dL (ref 6.0–8.3)

## 2013-12-03 LAB — BASIC METABOLIC PANEL WITH GFR
BUN: 15 mg/dL (ref 6–23)
CALCIUM: 9.3 mg/dL (ref 8.4–10.5)
CO2: 27 meq/L (ref 19–32)
CREATININE: 0.76 mg/dL (ref 0.50–1.10)
Chloride: 104 mEq/L (ref 96–112)
GFR, Est Non African American: 80 mL/min
GLUCOSE: 89 mg/dL (ref 70–99)
Potassium: 4.3 mEq/L (ref 3.5–5.3)
SODIUM: 140 meq/L (ref 135–145)

## 2013-12-03 NOTE — Progress Notes (Signed)
Subjective:    Patient ID: Christina Rowe, female    DOB: 1945-08-24, 68 y.o.   MRN: [id number]  HPI Comments: 68 yo WF 3 month cholesterol f/u. She is doing well overall. She is eating healthy and exercising routinely. She denies any SE with Cholesterol RX and only takes 1/2 MWF. She would like to have Q 6 month OV instead of Q 3 months. WBC             5.6   08/30/2013 HGB            15.0   08/30/2013 HCT            44.0   08/30/2013 PLT             229   08/30/2013 GLUCOSE          81   08/30/2013 CHOL            142   08/30/2013 TRIG            143   08/30/2013 HDL              36   08/30/2013 LDLCALC          77   08/30/2013 ALT              19   08/30/2013 AST              20   08/30/2013 NA              139   08/30/2013 K               4.2   08/30/2013 CL              105   08/30/2013 CREATININE     0.76   08/30/2013 BUN              16   08/30/2013 CO2              26   08/30/2013 TSH           1.688   08/30/2013 HGBA1C          5.5   08/30/2013   Hyperlipidemia     Medication List       This list is accurate as of: 12/03/13  9:07 AM.  Always use your most recent med list.               aspirin 81 MG tablet  Take 81 mg by mouth daily.     atorvastatin 80 MG tablet  Commonly known as:  LIPITOR  Take 80 mg by mouth daily.     CALCIUM 1200 PO  Take by mouth.     Fish Oil 1200 MG Caps  Take by mouth.     Flax Seed Oil 1000 MG Caps  Take by mouth.     Magnesium 250 MG Tabs  Take 250 mg by mouth daily.     MULTIVITAMIN PO  Take by mouth.     VITAMIN D (CHOLECALCIFEROL) PO  Take 5,000 mg by mouth daily.       Allergies  Allergen Reactions  . Ppd [Tuberculin Purified Protein Derivative]    Past Medical History  Diagnosis Date  . Hyperlipidemia   . Cancer     breast  . Hypertension   . Prediabetes   . Osteopenia   . Vitamin D deficiency   . Hyperlipemia 05/08/2013  . Hypomagnesemia 05/08/2013  Review of Systems  All other systems reviewed and are negative.  BP  104/66  Pulse 68  Temp(Src) 98 F (36.7 C) (Temporal)  Resp 16  Ht 5' 0.25" (1.53 m)  Wt 130 lb (58.968 kg)  BMI 25.19 kg/m2     Objective:   Physical Exam  Nursing note and vitals reviewed. Constitutional: She is oriented to person, place, and time. She appears well-developed and well-nourished. No distress.  HENT:  Head: Normocephalic and atraumatic.  Right Ear: External ear normal.  Left Ear: External ear normal.  Nose: Nose normal.  Eyes: Conjunctivae and EOM are normal.  Neck: Normal range of motion. Neck supple. No JVD present. No thyromegaly present.  Cardiovascular: Normal rate, regular rhythm, normal heart sounds and intact distal pulses.   Pulmonary/Chest: Effort normal and breath sounds normal.  Abdominal: Soft. Bowel sounds are normal. She exhibits no distension. There is no tenderness. There is no rebound.  Musculoskeletal: Normal range of motion. She exhibits no edema and no tenderness.  Lymphadenopathy:    She has no cervical adenopathy.  Neurological: She is alert and oriented to person, place, and time. No cranial nerve deficit.  Skin: Skin is warm and dry. No rash noted. No erythema. No pallor.  Psychiatric: She has a normal mood and affect. Her behavior is normal. Judgment and thought content normal.          Assessment & Plan:  1. Cholesterol- recheck labs, Need to eat healthier and exercise AD. Patient would like to only come in Q 6 months for f/u with labs stable and no SE with medications

## 2013-12-22 ENCOUNTER — Ambulatory Visit (INDEPENDENT_AMBULATORY_CARE_PROVIDER_SITE_OTHER): Payer: Medicare Other

## 2013-12-22 ENCOUNTER — Ambulatory Visit (INDEPENDENT_AMBULATORY_CARE_PROVIDER_SITE_OTHER): Payer: Medicare Other | Admitting: Family Medicine

## 2013-12-22 VITALS — BP 154/80 | HR 72 | Temp 98.4°F | Resp 20 | Ht 60.0 in | Wt 128.5 lb

## 2013-12-22 DIAGNOSIS — M25532 Pain in left wrist: Secondary | ICD-10-CM

## 2013-12-22 DIAGNOSIS — M25539 Pain in unspecified wrist: Secondary | ICD-10-CM | POA: Diagnosis not present

## 2013-12-22 DIAGNOSIS — S62109A Fracture of unspecified carpal bone, unspecified wrist, initial encounter for closed fracture: Secondary | ICD-10-CM

## 2013-12-22 DIAGNOSIS — S62102A Fracture of unspecified carpal bone, left wrist, initial encounter for closed fracture: Secondary | ICD-10-CM

## 2013-12-22 NOTE — Progress Notes (Signed)
The chart was scribed for Christina Haber, MD, by Christina Rowe, ED Scribe. This patient's care was started at 3:06 PM.   Patient ID: Christina Rowe MRN: [id number], DOB: 1945-08-22, 68 y.o. Date of Encounter: 12/22/2013, 3:05 PM  Primary Physician: [name], MD  Chief Complaint:  Chief Complaint  Patient presents with   Fall    this am   Wrist Injury    Left     HPI: 68 y.o. year old female with history below presents with acute pain to her left wrist began after she fell upon her hand this morning. The pt reports associated swelling to her fingers. She denies a h/o fractures.   Patient prefers no pain meds  Ms. Christina Rowe reports she takes care of her mentally-impaired granddaughter.   Past Medical History  Diagnosis Date   Hyperlipidemia    Cancer     breast   Hypertension    Prediabetes    Osteopenia    Vitamin D deficiency    Hyperlipemia 05/08/2013   Hypomagnesemia 05/08/2013     Home Meds: Prior to Admission medications   Medication Sig Start Date End Date Taking? Authorizing Provider  aspirin 81 MG tablet Take 81 mg by mouth daily.     Yes Historical Provider, MD  atorvastatin (LIPITOR) 80 MG tablet Take 80 mg by mouth daily.   Yes Historical Provider, MD  Calcium Carbonate-Vit D-Min (CALCIUM 1200 PO) Take by mouth.     Yes Historical Provider, MD  Flaxseed, Linseed, (FLAX SEED OIL) 1000 MG CAPS Take by mouth.     Yes Historical Provider, MD  Magnesium 250 MG TABS Take 250 mg by mouth daily.   Yes Historical Provider, MD  Multiple Vitamin (MULTIVITAMIN PO) Take by mouth.    Yes Historical Provider, MD  Omega-3 Fatty Acids (FISH OIL) 1200 MG CAPS Take by mouth.     Yes Historical Provider, MD  VITAMIN D, CHOLECALCIFEROL, PO Take 5,000 mg by mouth daily.    Yes Historical Provider, MD    Allergies:  Allergies  Allergen Reactions   Ppd [Tuberculin Purified Protein Derivative]     History   Social History   Marital Status: Divorced      Spouse Name: N/A    Number of Children: N/A   Years of Education: N/A   Occupational History   Not on file.   Social History Main Topics   Smoking status: Never Smoker    Smokeless tobacco: Never Used   Alcohol Use: No   Drug Use: No   Sexual Activity: Not on file   Other Topics Concern   Not on file   Social History Narrative   No narrative on file     Review of Systems: Constitutional: negative for chills, fever, night sweats, weight changes, or fatigue  HEENT: negative for vision changes, hearing loss, congestion, rhinorrhea, ST, epistaxis, or sinus pressure Cardiovascular: negative for chest pain or palpitations Respiratory: negative for hemoptysis, wheezing, shortness of breath, or cough Abdominal: negative for abdominal pain, nausea, vomiting, diarrhea, or constipation Msk: positive pain to left wrist  Dermatological: negative for rash Neurologic: negative for headache, dizziness, or syncope All other systems reviewed and are otherwise negative with the exception to those above and in the HPI.   Physical Exam: Triage Vitals: Blood pressure 154/80, pulse 72, temperature 98.4 F (36.9 C), temperature source Oral, resp. rate 20, height 5' (1.524 m), weight 128 lb 8 oz (58.287 kg), SpO2 97.00%., Body mass index is 25.1 kg/(m^2).  General: Well developed, well nourished, in no acute distress. Head: Normocephalic, atraumatic, eyes without discharge, sclera non-icteric, nares are without discharge. Bilateral auditory canals clear, TM's are without perforation, pearly grey and translucent with reflective cone of light bilaterally. Oral cavity moist, posterior pharynx without exudate, erythema, peritonsillar abscess, or post nasal drip.  Neck: Supple. No thyromegaly. Full ROM. No lymphadenopathy. Lungs: Clear bilaterally to auscultation without wheezes, rales, or rhonchi. Breathing is unlabored. Heart: RRR with S1 S2. No murmurs, rubs, or gallops  appreciated. Abdomen: Soft, non-tender, non-distended with normoactive bowel sounds. No hepatomegaly. No rebound/guarding. No obvious abdominal masses. Msk:  Strength and tone normal for age. Extremities/Skin: Warm and dry. No clubbing or cyanosis. No edema. No rashes or suspicious lesions. Neuro: Alert and oriented X 3. Moves all extremities spontaneously. Gait is normal. CNII-XII grossly in tact. Psych:  Responds to questions appropriately with a normal affect.   [facility] reading (PRIMARY) by  Dr. Joseph Art: left distal radius fx, nondisplaced..  ASSESSMENT AND PLAN:  68 y.o. year old female with Wrist pain, left - Plan: DG Wrist Complete Left, Ambulatory referral to Orthopedic Surgery  Wrist fracture, closed, left, initial encounter - Plan: Ambulatory referral to Orthopedic Surgery  Sugar tong splint   Signed, Christina Haber, MD 12/22/2013 3:05 PM

## 2013-12-22 NOTE — Patient Instructions (Signed)
Wrist Fracture °A wrist fracture is a break in one of the bones of the wrist. Your wrist is made up of several small bones at the palm of your hand (carpal bones) and the two bones that make up your forearm (radius and ulna). The bones come together to form multiple large and small joints. The shape and design of these joints allow your wrist to bend and straighten, move side-to-side, and rotate, as in twisting your palm up or down. °CAUSES  °A fracture may occur in any of the bones in your wrist when enough force is applied to the wrist, such as when falling down onto an outstretched hand. Severe injuries may occur from a more forceful injury. °SYMPTOMS °Symptoms of wrist fractures include tenderness, bruising, and swelling. Additionally, the wrist may hang in an odd position or may be misshaped. °DIAGNOSIS °To diagnose a wrist fracture, your caregiver will physically examine your wrist. Your caregiver may also request an X-ray exam of your wrist. °TREATMENT °Treatment depends on many factors, including the nature and location of the fracture, your age, and your activity level. Treatment for wrist fracture can be nonsurgical or surgical. °For nonsurgical treatment, a plaster cast or splint may be applied to your wrist if the bone is in a good position (aligned). The cast will stay on for about 6 weeks. If the alignment of your bone is not good, it may be necessary to realign (reduce) it. After the bone is reduced, a splint usually is placed on your wrist to allow for a small amount of normal swelling. After about 1 week, the splint is removed and a cast is added. The cast is removed 2 or 3 weeks later, after the swelling goes down, causing the cast to loosen. Another cast is applied. This cast is removed after about another 2 or 3 weeks, for a total of 4 to 6 weeks of immobilization. °Sometimes the position of the bone is so far out of place that surgery is required to apply a device to hold it together as it  heals. If the bone cannot be reduced without cutting the skin around the bone (closed reduction), a cut (incision) is made to allow direct access to the bone to reduce it (open reduction). Depending on the fracture, there are a number of options for holding the bone in place while it heals, including a cast, metal pins, a plate and screws, and a device called an external fixator. With an external fixator, most of the hardware remains outside of the body. °HOME CARE INSTRUCTIONS °· To lessen swelling, keep your injured wrist elevated and move your fingers as much as possible. °· Apply ice to your wrist for the first 1 to 2 days after you have been treated or as directed by your caregiver. Applying ice helps to reduce inflammation and pain. °¨ Put ice in a plastic bag. °¨ Place a towel between your skin and the bag. °¨ Leave the ice on for 15 to 20 minutes at a time, every 2 hours while you are awake. °· Do not put pressure on any part of your cast or splint. It may break. °· Use a plastic bag to protect your cast or splint from water while bathing or showering. Do not lower your cast or splint into water. °· Only take over-the-counter or prescription medicines for pain as directed by your caregiver. °SEEK IMMEDIATE MEDICAL CARE IF:  °· Your cast or splint gets damaged or breaks. °· You have continued severe pain   or more swelling than you did before the cast was put on. °· Your skin or fingernails below the injury turn blue or gray or feel cold or numb. °· You develop decreased feeling in your fingers. °MAKE SURE YOU: °· Understand these instructions. °· Will watch your condition. °· Will get help right away if you are not doing well or get worse. °Document Released: 03/24/2005 Document Revised: 09/06/2011 Document Reviewed: 07/02/2011 °[employer]® Patient Information ©2015 [employer]. This information is not intended to replace advice given to you by your health care provider. Make sure you discuss any questions you  have with your health care provider. ° °

## 2013-12-24 DIAGNOSIS — S52599A Other fractures of lower end of unspecified radius, initial encounter for closed fracture: Secondary | ICD-10-CM | POA: Diagnosis not present

## 2013-12-31 DIAGNOSIS — S5290XD Unspecified fracture of unspecified forearm, subsequent encounter for closed fracture with routine healing: Secondary | ICD-10-CM | POA: Diagnosis not present

## 2014-01-16 ENCOUNTER — Other Ambulatory Visit: Payer: Self-pay | Admitting: *Deleted

## 2014-01-16 MED ORDER — ATORVASTATIN CALCIUM 80 MG PO TABS: 80.0000 mg | ORAL_TABLET | Freq: Every day | ORAL | Status: DC

## 2014-01-24 DIAGNOSIS — S5290XD Unspecified fracture of unspecified forearm, subsequent encounter for closed fracture with routine healing: Secondary | ICD-10-CM | POA: Diagnosis not present

## 2014-02-04 DIAGNOSIS — M79609 Pain in unspecified limb: Secondary | ICD-10-CM | POA: Diagnosis not present

## 2014-02-04 DIAGNOSIS — S5290XA Unspecified fracture of unspecified forearm, initial encounter for closed fracture: Secondary | ICD-10-CM | POA: Diagnosis not present

## 2014-02-04 DIAGNOSIS — M25649 Stiffness of unspecified hand, not elsewhere classified: Secondary | ICD-10-CM | POA: Diagnosis not present

## 2014-02-06 DIAGNOSIS — M25649 Stiffness of unspecified hand, not elsewhere classified: Secondary | ICD-10-CM | POA: Diagnosis not present

## 2014-02-06 DIAGNOSIS — S5290XA Unspecified fracture of unspecified forearm, initial encounter for closed fracture: Secondary | ICD-10-CM | POA: Diagnosis not present

## 2014-02-06 DIAGNOSIS — M79609 Pain in unspecified limb: Secondary | ICD-10-CM | POA: Diagnosis not present

## 2014-02-11 DIAGNOSIS — S5290XA Unspecified fracture of unspecified forearm, initial encounter for closed fracture: Secondary | ICD-10-CM | POA: Diagnosis not present

## 2014-02-11 DIAGNOSIS — M79609 Pain in unspecified limb: Secondary | ICD-10-CM | POA: Diagnosis not present

## 2014-02-11 DIAGNOSIS — M25649 Stiffness of unspecified hand, not elsewhere classified: Secondary | ICD-10-CM | POA: Diagnosis not present

## 2014-02-14 DIAGNOSIS — M79609 Pain in unspecified limb: Secondary | ICD-10-CM | POA: Diagnosis not present

## 2014-02-14 DIAGNOSIS — S5290XA Unspecified fracture of unspecified forearm, initial encounter for closed fracture: Secondary | ICD-10-CM | POA: Diagnosis not present

## 2014-02-14 DIAGNOSIS — M25649 Stiffness of unspecified hand, not elsewhere classified: Secondary | ICD-10-CM | POA: Diagnosis not present

## 2014-02-18 ENCOUNTER — Encounter: Payer: Self-pay | Admitting: Physician Assistant

## 2014-02-18 DIAGNOSIS — S5290XA Unspecified fracture of unspecified forearm, initial encounter for closed fracture: Secondary | ICD-10-CM | POA: Diagnosis not present

## 2014-02-18 DIAGNOSIS — M25649 Stiffness of unspecified hand, not elsewhere classified: Secondary | ICD-10-CM | POA: Diagnosis not present

## 2014-02-18 DIAGNOSIS — M79609 Pain in unspecified limb: Secondary | ICD-10-CM | POA: Diagnosis not present

## 2014-02-21 DIAGNOSIS — S5290XA Unspecified fracture of unspecified forearm, initial encounter for closed fracture: Secondary | ICD-10-CM | POA: Diagnosis not present

## 2014-02-21 DIAGNOSIS — M25649 Stiffness of unspecified hand, not elsewhere classified: Secondary | ICD-10-CM | POA: Diagnosis not present

## 2014-02-21 DIAGNOSIS — M79609 Pain in unspecified limb: Secondary | ICD-10-CM | POA: Diagnosis not present

## 2014-02-25 DIAGNOSIS — S5290XD Unspecified fracture of unspecified forearm, subsequent encounter for closed fracture with routine healing: Secondary | ICD-10-CM | POA: Diagnosis not present

## 2014-02-26 DIAGNOSIS — S5290XA Unspecified fracture of unspecified forearm, initial encounter for closed fracture: Secondary | ICD-10-CM | POA: Diagnosis not present

## 2014-02-26 DIAGNOSIS — M25649 Stiffness of unspecified hand, not elsewhere classified: Secondary | ICD-10-CM | POA: Diagnosis not present

## 2014-02-26 DIAGNOSIS — M79609 Pain in unspecified limb: Secondary | ICD-10-CM | POA: Diagnosis not present

## 2014-03-05 ENCOUNTER — Encounter: Payer: Self-pay | Admitting: Physician Assistant

## 2014-03-05 DIAGNOSIS — Z23 Encounter for immunization: Secondary | ICD-10-CM | POA: Diagnosis not present

## 2014-04-09 ENCOUNTER — Ambulatory Visit (INDEPENDENT_AMBULATORY_CARE_PROVIDER_SITE_OTHER): Payer: Medicare Other | Admitting: Physician Assistant

## 2014-04-09 ENCOUNTER — Encounter: Payer: Self-pay | Admitting: Physician Assistant

## 2014-04-09 VITALS — BP 132/78 | HR 60 | Temp 98.1°F | Resp 16 | Ht 60.25 in | Wt 129.0 lb

## 2014-04-09 DIAGNOSIS — E785 Hyperlipidemia, unspecified: Secondary | ICD-10-CM

## 2014-04-09 DIAGNOSIS — I10 Essential (primary) hypertension: Secondary | ICD-10-CM | POA: Diagnosis not present

## 2014-04-09 DIAGNOSIS — Z79899 Other long term (current) drug therapy: Secondary | ICD-10-CM

## 2014-04-09 DIAGNOSIS — Z9181 History of falling: Secondary | ICD-10-CM

## 2014-04-09 DIAGNOSIS — E559 Vitamin D deficiency, unspecified: Secondary | ICD-10-CM

## 2014-04-09 DIAGNOSIS — R6889 Other general symptoms and signs: Secondary | ICD-10-CM | POA: Diagnosis not present

## 2014-04-09 DIAGNOSIS — Z23 Encounter for immunization: Secondary | ICD-10-CM | POA: Diagnosis not present

## 2014-04-09 DIAGNOSIS — M8580 Other specified disorders of bone density and structure, unspecified site: Secondary | ICD-10-CM | POA: Insufficient documentation

## 2014-04-09 DIAGNOSIS — Z0001 Encounter for general adult medical examination with abnormal findings: Secondary | ICD-10-CM | POA: Diagnosis not present

## 2014-04-09 DIAGNOSIS — N182 Chronic kidney disease, stage 2 (mild): Secondary | ICD-10-CM | POA: Insufficient documentation

## 2014-04-09 DIAGNOSIS — R7309 Other abnormal glucose: Secondary | ICD-10-CM | POA: Diagnosis not present

## 2014-04-09 DIAGNOSIS — C50911 Malignant neoplasm of unspecified site of right female breast: Secondary | ICD-10-CM

## 2014-04-09 DIAGNOSIS — R7303 Prediabetes: Secondary | ICD-10-CM

## 2014-04-09 LAB — TSH: TSH: 1.772 u[IU]/mL (ref 0.350–4.500)

## 2014-04-09 LAB — LIPID PANEL
CHOL/HDL RATIO: 4.7 ratio
Cholesterol: 175 mg/dL (ref 0–200)
HDL: 37 mg/dL — ABNORMAL LOW (ref >39)
LDL Cholesterol: 112 mg/dL — ABNORMAL HIGH (ref 0–99)
Triglycerides: 132 mg/dL (ref <150)
VLDL: 26 mg/dL (ref 0–40)

## 2014-04-09 LAB — CBC WITH DIFFERENTIAL/PLATELET
Basophils Absolute: 0.1 10*3/uL (ref 0.0–0.1)
Basophils Relative: 1 % (ref 0–1)
Eosinophils Absolute: 0.1 10*3/uL (ref 0.0–0.7)
Eosinophils Relative: 1 % (ref 0–5)
HCT: 44.4 % (ref 36.0–46.0)
Hemoglobin: 14.8 g/dL (ref 12.0–15.0)
Lymphocytes Relative: 35 % (ref 12–46)
Lymphs Abs: 1.8 10*3/uL (ref 0.7–4.0)
MCH: 30.6 pg (ref 26.0–34.0)
MCHC: 33.3 g/dL (ref 30.0–36.0)
MCV: 91.9 fL (ref 78.0–100.0)
Monocytes Absolute: 0.5 10*3/uL (ref 0.1–1.0)
Monocytes Relative: 9 % (ref 3–12)
Neutro Abs: 2.8 10*3/uL (ref 1.7–7.7)
Neutrophils Relative %: 54 % (ref 43–77)
Platelets: 265 10*3/uL (ref 150–400)
RBC: 4.83 MIL/uL (ref 3.87–5.11)
RDW: 14.1 % (ref 11.5–15.5)
WBC: 5.1 10*3/uL (ref 4.0–10.5)

## 2014-04-09 LAB — BASIC METABOLIC PANEL WITHOUT GFR
BUN: 17 mg/dL (ref 6–23)
CO2: 29 meq/L (ref 19–32)
Calcium: 9.7 mg/dL (ref 8.4–10.5)
Chloride: 102 meq/L (ref 96–112)
Creat: 0.79 mg/dL (ref 0.50–1.10)
GFR, Est African American: 88 mL/min
GFR, Est Non African American: 76 mL/min
Glucose, Bld: 89 mg/dL (ref 70–99)
Potassium: 4.2 meq/L (ref 3.5–5.3)
Sodium: 141 meq/L (ref 135–145)

## 2014-04-09 LAB — HEPATIC FUNCTION PANEL
ALBUMIN: 4.3 g/dL (ref 3.5–5.2)
ALK PHOS: 65 U/L (ref 39–117)
ALT: 28 U/L (ref 0–35)
AST: 26 U/L (ref 0–37)
Bilirubin, Direct: 0.1 mg/dL (ref 0.0–0.3)
Indirect Bilirubin: 0.6 mg/dL (ref 0.2–1.2)
TOTAL PROTEIN: 6.8 g/dL (ref 6.0–8.3)
Total Bilirubin: 0.7 mg/dL (ref 0.2–1.2)

## 2014-04-09 LAB — HEMOGLOBIN A1C
Hgb A1c MFr Bld: 5.9 % — ABNORMAL HIGH (ref <5.7)
Mean Plasma Glucose: 123 mg/dL — ABNORMAL HIGH (ref <117)

## 2014-04-09 NOTE — Progress Notes (Signed)
MEDICARE ANNUAL WELLNESS VISIT AND CPE  Assessment:   1. Essential hypertension - CBC with Differential - BASIC METABOLIC PANEL WITH GFR - Hepatic function panel - TSH - Urinalysis, Routine w reflex microscopic - Microalbumin / creatinine urine ratio - EKG 12-Lead  2. Prediabetes Discussed general issues about diabetes pathophysiology and management., Educational material distributed., Suggested low cholesterol diet., Encouraged aerobic exercise., Discussed foot care., Reminded to get yearly retinal exam. - Hemoglobin A1c - Insulin, fasting - [facility] DIABETES FOOT EXAM  3. Hyperlipidemia - Lipid panel  4. Vitamin D deficiency  5. Hypomagnesemia Check Mag  6. Encounter for long-term current use of medication  7. Breast cancer, right Continue yearly MGM  8. Osteopenia Will get DEXA 2016 at [facility]  9. CKD (chronic kidney disease) stage 2, GFR 60-89 ml/min Check BMP   Plan:   During the course of the visit the patient was educated and counseled about appropriate screening and preventive services including:    Pneumococcal vaccine   Influenza vaccine  Td vaccine  Screening electrocardiogram  Bone densitometry screening  Colorectal cancer screening  Diabetes screening  Glaucoma screening  Nutrition counseling   Advanced directives: requested  Screening recommendations, referrals: Vaccinations: Please see documentation below and orders this visit.  Nutrition assessed and recommended  Colonoscopy uptodate Recommended yearly ophthalmology/optometry visit for glaucoma screening and checkup Recommended yearly dental visit for hygiene and checkup Advanced directives - requested  Conditions/risks identified: BMI: Discussed weight loss, diet, and increase physical activity.  Increase physical activity: AHA recommends 150 minutes of physical activity a week.  Medications reviewed Urinary Incontinence is not an issue: discussed non pharmacology and  pharmacology options.  Fall risk: moderate- discussed PT, home fall assessment, medications.    Subjective:  Christina Rowe is a 68 y.o. female who presents for Medicare Annual Wellness Visit and complete physical.  Date of last medicare wellness visit is unknown.   Her blood pressure has been controlled at home, today their BP is BP: 132/78 mmHg She does workout. She denies chest pain, shortness of breath, dizziness.  She is on cholesterol medication and denies myalgias. Her cholesterol is at goal. The cholesterol last visit was:   Lab Results  Component Value Date   CHOL 167 12/03/2013   HDL 40 12/03/2013   LDLCALC 102* 12/03/2013   TRIG 126 12/03/2013   CHOLHDL 4.2 12/03/2013    Last A1C in the office was:  Lab Results  Component Value Date   HGBA1C 5.5 08/30/2013   Patient is on Vitamin D supplement.   Lab Results  Component Value Date   VD25OH 78 08/30/2013     She had a right breast cancer s/p lumpectomy in 1996, gets yearly MGM's at OB/GYN.  She was pulling vines this July in her yard when she fell and broke her left wrist.  She takes care of her sick, biopolar daughter (had amputation) and grand daughter.   Names of Other Physician/Practitioners you currently use: 1. [facility] here for primary care 2. Dr. Gershon Crane, eye doctor, last visit yearly 3. Dr. Ennis Forts, dentist, last visit 2 yearly Patient Care Team: Unk Pinto, MD as PCP - General (Internal Medicine) Jeryl Columbia, MD as Consulting Physician (Gastroenterology) Autumn Messing III, MD as Consulting Physician (General Surgery) Ardath Sax, MD as Referring Physician (Obstetrics and Gynecology)   Medication Review: Current Outpatient Prescriptions on File Prior to Visit  Medication Sig Dispense Refill  . aspirin 81 MG tablet Take 81 mg  by mouth daily.        Marland Kitchen atorvastatin (LIPITOR) 80 MG tablet Take 1 tablet (80 mg total) by mouth daily.  90 tablet  1  . Calcium Carbonate-Vit D-Min  (CALCIUM 1200 PO) Take by mouth.        . Flaxseed, Linseed, (FLAX SEED OIL) 1000 MG CAPS Take by mouth.        . Magnesium 250 MG TABS Take 250 mg by mouth daily.      . Multiple Vitamin (MULTIVITAMIN PO) Take by mouth.       . Omega-3 Fatty Acids (FISH OIL) 1200 MG CAPS Take by mouth.        Marland Kitchen VITAMIN D, CHOLECALCIFEROL, PO Take 5,000 mg by mouth daily.        No current facility-administered medications on file prior to visit.    Current Problems (verified) Patient Active Problem List   Diagnosis Date Noted  . Osteopenia 04/09/2014  . Encounter for long-term current use of medication 08/30/2013  . Hypomagnesemia 05/08/2013  . Prediabetes   . Hyperlipidemia   . Hypertension   . Vitamin D deficiency   . Breast cancer 06/07/2011    Screening Tests Health Maintenance  Topic Date Due  . Mammogram  10/07/1993  . Pneumococcal Polysaccharide Vaccine Age 31 And Over  10/07/2008  . Influenza Vaccine  01/27/2015  . Tetanus/tdap  07/07/2017  . Colonoscopy  05/08/2019  . Zostavax  Completed    Immunization History  Administered Date(s) Administered  . Influenza-Unspecified 03/05/2014  . Pneumococcal-Unspecified 07/07/2008  . Td 07/08/2007  . Zoster 07/08/2007    Preventative care: Last colonoscopy: 2010 Last mammogram: 07/2013 Last pap smear/pelvic exam: 2013 DEXA: 12/2011 at [facility]   Prior vaccinations: TD or Tdap: 2009  Influenza: 2015 Pneumococcal: 2010 Prevnar: [facility] Shingles/Zostavax: 2009  History reviewed: allergies, current medications, past family history, past medical history, past social history, past surgical history and problem list   Risk Factors: Osteoporosis/FallRisk: postmenopausal estrogen deficiency and dietary calcium and/or vitamin D deficiency In the past year have you fallen or had a near fall?:Yes History of fracture in the past year: yes  Tobacco History  Substance Use Topics  . Smoking status: Never Smoker   . Smokeless tobacco:  Never Used  . Alcohol Use: No   She does not smoke.  Patient is not a former smoker. Are there smokers in your home (other than you)?  No  Alcohol Current alcohol use: none  Caffeine Current caffeine use: coffee 1 /day  Exercise Current exercise: walking  Nutrition/Diet Current diet: in general, a "healthy" diet    Cardiac risk factors: advanced age (older than 29 for men, 49 for women), dyslipidemia and hypertension.  Depression Screen (Note: if answer to either of the following is "Yes", a more complete depression screening is indicated)   Q1: Over the past two weeks, have you felt down, depressed or hopeless? No  Q2: Over the past two weeks, have you felt little interest or pleasure in doing things? No  Have you lost interest or pleasure in daily life? No  Do you often feel hopeless? No  Do you cry easily over simple problems? No  Activities of Daily Living In your present state of health, do you have any difficulty performing the following activities?:  Driving? No Managing money?  No Feeding yourself? No Getting from bed to chair? No Climbing a flight of stairs? No Preparing food and eating?: No Bathing or showering? No Getting dressed: No Getting  to the toilet? No Using the toilet:No Moving around from place to place: No In the past year have you fallen or had a near fall?:Yes   Are you sexually active?  No  Do you have more than one partner?  No  Vision Difficulties: No  Hearing Difficulties: No Do you often ask people to speak up or repeat themselves? No Do you experience ringing or noises in your ears? No Do you have difficulty understanding soft or whispered voices? No  Cognition  Do you feel that you have a problem with memory?No  Do you often misplace items? No  Do you feel safe at home?  Yes  Advanced directives Does patient have a [place]? Yes Does patient have a Living Will? Yes   Objective:     Blood pressure  132/78, pulse 60, temperature 98.1 F (36.7 C), resp. rate 16, height 5' 0.25" (1.53 m), weight 129 lb (58.514 kg). Body mass index is 25 kg/(m^2).  General appearance: alert, no distress, WD/WN, female Cognitive Testing  Alert? Yes  Normal Appearance?Yes  Oriented to person? Yes  Place? Yes   Time? Yes  Recall of three objects?  Yes  Can perform simple calculations? Yes  Displays appropriate judgment?Yes  Can read the correct time from a watch face?Yes  HEENT: normocephalic, sclerae anicteric, TMs pearly, nares patent, no discharge or erythema, pharynx normal Oral cavity: MMM, no lesions Neck: supple, no lymphadenopathy, no thyromegaly, no masses Heart: RRR, normal S1, S2, no murmurs Lungs: CTA bilaterally, no wheezes, rhonchi, or rales Abdomen: +bs, soft, non tender, non distended, no masses, no hepatomegaly, no splenomegaly Musculoskeletal: nontender, no swelling, no obvious deformity Extremities: no edema, no cyanosis, no clubbing Pulses: 2+ symmetric, upper and lower extremities, normal cap refill Neurological: alert, oriented x 3, CN2-12 intact, strength normal upper extremities and lower extremities, sensation normal throughout, DTRs 2+ throughout, no cerebellar signs, gait normal Psychiatric: normal affect, behavior normal, pleasant   Medicare Attestation I have personally reviewed: The patient's medical and social history Their use of alcohol, tobacco or illicit drugs Their current medications and supplements The patient's functional ability including ADLs,fall risks, home safety risks, cognitive, and hearing and visual impairment Diet and physical activities Evidence for depression or mood disorders  The patient's weight, height, BMI, and visual acuity have been recorded in the chart.  I have made referrals, counseling, and provided education to the patient based on review of the above and I have provided the patient with a written personalized care plan for preventive  services.     Vicie Mutters, PA-C   04/09/2014

## 2014-04-10 LAB — URINALYSIS, ROUTINE W REFLEX MICROSCOPIC
Bilirubin Urine: NEGATIVE
GLUCOSE, UA: NEGATIVE mg/dL
HGB URINE DIPSTICK: NEGATIVE
Ketones, ur: NEGATIVE mg/dL
Leukocytes, UA: NEGATIVE
Nitrite: NEGATIVE
PH: 7.5 (ref 5.0–8.0)
Protein, ur: NEGATIVE mg/dL
Specific Gravity, Urine: 1.02 (ref 1.005–1.030)
Urobilinogen, UA: 0.2 mg/dL (ref 0.0–1.0)

## 2014-04-10 LAB — MICROALBUMIN / CREATININE URINE RATIO
Creatinine, Urine: 148.9 mg/dL
MICROALB/CREAT RATIO: 4 mg/g (ref 0.0–30.0)
Microalb, Ur: 0.6 mg/dL (ref <2.0)

## 2014-04-10 LAB — INSULIN, FASTING: INSULIN FASTING, SERUM: 4.3 u[IU]/mL (ref 2.0–19.6)

## 2014-08-22 DIAGNOSIS — Z1231 Encounter for screening mammogram for malignant neoplasm of breast: Secondary | ICD-10-CM | POA: Diagnosis not present

## 2014-08-22 DIAGNOSIS — Z853 Personal history of malignant neoplasm of breast: Secondary | ICD-10-CM | POA: Diagnosis not present

## 2014-09-15 ENCOUNTER — Encounter: Payer: Self-pay | Admitting: *Deleted

## 2014-10-17 DIAGNOSIS — C50411 Malignant neoplasm of upper-outer quadrant of right female breast: Secondary | ICD-10-CM | POA: Diagnosis not present

## 2014-11-28 DIAGNOSIS — D1801 Hemangioma of skin and subcutaneous tissue: Secondary | ICD-10-CM | POA: Diagnosis not present

## 2014-11-28 DIAGNOSIS — H2513 Age-related nuclear cataract, bilateral: Secondary | ICD-10-CM | POA: Diagnosis not present

## 2014-11-28 DIAGNOSIS — L821 Other seborrheic keratosis: Secondary | ICD-10-CM | POA: Diagnosis not present

## 2014-11-28 DIAGNOSIS — D229 Melanocytic nevi, unspecified: Secondary | ICD-10-CM | POA: Diagnosis not present

## 2014-11-28 DIAGNOSIS — L812 Freckles: Secondary | ICD-10-CM | POA: Diagnosis not present

## 2014-11-28 DIAGNOSIS — Z85828 Personal history of other malignant neoplasm of skin: Secondary | ICD-10-CM | POA: Diagnosis not present

## 2014-12-11 ENCOUNTER — Encounter: Payer: Self-pay | Admitting: Physician Assistant

## 2014-12-11 ENCOUNTER — Ambulatory Visit (INDEPENDENT_AMBULATORY_CARE_PROVIDER_SITE_OTHER): Payer: Medicare Other | Admitting: Physician Assistant

## 2014-12-11 VITALS — BP 130/72 | HR 68 | Temp 97.8°F | Resp 16 | Ht 60.5 in | Wt 132.0 lb

## 2014-12-11 DIAGNOSIS — R7309 Other abnormal glucose: Secondary | ICD-10-CM | POA: Diagnosis not present

## 2014-12-11 DIAGNOSIS — Z1331 Encounter for screening for depression: Secondary | ICD-10-CM

## 2014-12-11 DIAGNOSIS — Z6825 Body mass index (BMI) 25.0-25.9, adult: Secondary | ICD-10-CM

## 2014-12-11 DIAGNOSIS — R7303 Prediabetes: Secondary | ICD-10-CM

## 2014-12-11 DIAGNOSIS — N182 Chronic kidney disease, stage 2 (mild): Secondary | ICD-10-CM | POA: Diagnosis not present

## 2014-12-11 DIAGNOSIS — E785 Hyperlipidemia, unspecified: Secondary | ICD-10-CM | POA: Diagnosis not present

## 2014-12-11 DIAGNOSIS — E2839 Other primary ovarian failure: Secondary | ICD-10-CM

## 2014-12-11 DIAGNOSIS — I10 Essential (primary) hypertension: Secondary | ICD-10-CM

## 2014-12-11 DIAGNOSIS — Z79899 Other long term (current) drug therapy: Secondary | ICD-10-CM | POA: Diagnosis not present

## 2014-12-11 DIAGNOSIS — E559 Vitamin D deficiency, unspecified: Secondary | ICD-10-CM

## 2014-12-11 LAB — BASIC METABOLIC PANEL WITH GFR
BUN: 13 mg/dL (ref 6–23)
CO2: 30 mEq/L (ref 19–32)
Calcium: 10.3 mg/dL (ref 8.4–10.5)
Chloride: 106 mEq/L (ref 96–112)
Creat: 0.8 mg/dL (ref 0.50–1.10)
GFR, EST NON AFRICAN AMERICAN: 74 mL/min
GFR, Est African American: 86 mL/min
GLUCOSE: 83 mg/dL (ref 70–99)
POTASSIUM: 4.5 meq/L (ref 3.5–5.3)
SODIUM: 143 meq/L (ref 135–145)

## 2014-12-11 LAB — LIPID PANEL
CHOLESTEROL: 153 mg/dL (ref 0–200)
HDL: 35 mg/dL — ABNORMAL LOW (ref >=46)
LDL Cholesterol: 100 mg/dL — ABNORMAL HIGH (ref 0–99)
Total CHOL/HDL Ratio: 4.4 Ratio
Triglycerides: 89 mg/dL (ref <150)
VLDL: 18 mg/dL (ref 0–40)

## 2014-12-11 LAB — CBC WITH DIFFERENTIAL/PLATELET
BASOS ABS: 0.1 10*3/uL (ref 0.0–0.1)
BASOS PCT: 1 % (ref 0–1)
EOS ABS: 0.1 10*3/uL (ref 0.0–0.7)
Eosinophils Relative: 2 % (ref 0–5)
HCT: 42.9 % (ref 36.0–46.0)
Hemoglobin: 14.5 g/dL (ref 12.0–15.0)
Lymphocytes Relative: 31 % (ref 12–46)
Lymphs Abs: 1.6 10*3/uL (ref 0.7–4.0)
MCH: 31.5 pg (ref 26.0–34.0)
MCHC: 33.8 g/dL (ref 30.0–36.0)
MCV: 93.3 fL (ref 78.0–100.0)
MONOS PCT: 7 % (ref 3–12)
MPV: 10.4 fL (ref 8.6–12.4)
Monocytes Absolute: 0.4 10*3/uL (ref 0.1–1.0)
NEUTROS ABS: 3 10*3/uL (ref 1.7–7.7)
NEUTROS PCT: 59 % (ref 43–77)
PLATELETS: 240 10*3/uL (ref 150–400)
RBC: 4.6 MIL/uL (ref 3.87–5.11)
RDW: 13.7 % (ref 11.5–15.5)
WBC: 5.1 10*3/uL (ref 4.0–10.5)

## 2014-12-11 LAB — HEPATIC FUNCTION PANEL
ALK PHOS: 60 U/L (ref 39–117)
ALT: 26 U/L (ref 0–35)
AST: 26 U/L (ref 0–37)
Albumin: 4.2 g/dL (ref 3.5–5.2)
BILIRUBIN DIRECT: 0.1 mg/dL (ref 0.0–0.3)
BILIRUBIN TOTAL: 0.5 mg/dL (ref 0.2–1.2)
Indirect Bilirubin: 0.4 mg/dL (ref 0.2–1.2)
Total Protein: 6.6 g/dL (ref 6.0–8.3)

## 2014-12-11 LAB — TSH: TSH: 1.253 u[IU]/mL (ref 0.350–4.500)

## 2014-12-11 LAB — MAGNESIUM: Magnesium: 2.4 mg/dL (ref 1.5–2.5)

## 2014-12-11 LAB — HEMOGLOBIN A1C
Hgb A1c MFr Bld: 5.8 % — ABNORMAL HIGH (ref <5.7)
Mean Plasma Glucose: 120 mg/dL — ABNORMAL HIGH (ref <117)

## 2014-12-11 NOTE — Progress Notes (Signed)
Assessment and Plan:  1. Hypertension -Continue medication, monitor blood pressure at home. Continue DASH diet.  Reminder to go to the ER if any CP, SOB, nausea, dizziness, severe HA, changes vision/speech, left arm numbness and tingling and jaw pain.  2. Cholesterol -Continue diet and exercise. Check cholesterol.   3. Prediabetes  -Continue diet and exercise. Check A1C  4. Vitamin D Def - check level and continue medications.   Continue diet and meds as discussed. Further disposition pending results of labs. Over 30 minutes of exam, counseling, chart review, and critical decision making was performed  HPI 69 y.o. female  presents for 3 month follow up on hypertension, cholesterol, prediabetes, and vitamin D deficiency.   Her blood pressure has been controlled at home, today their BP is BP: 130/72 mmHg  She does workout. She denies chest pain, shortness of breath, dizziness. She has CKD due to HTN and age related.   She is on cholesterol medication and denies myalgias. Her cholesterol is at goal. The cholesterol last visit was:   Lab Results  Component Value Date   CHOL 175 04/09/2014   HDL 37* 04/09/2014   LDLCALC 112* 04/09/2014   TRIG 132 04/09/2014   CHOLHDL 4.7 04/09/2014    She has been working on diet and exercise for prediabetes, and denies paresthesia of the feet, polydipsia, polyuria and visual disturbances. Last A1C in the office was:  Lab Results  Component Value Date   HGBA1C 5.9* 04/09/2014   Patient is on Vitamin D supplement.   Lab Results  Component Value Date   VD25OH 78 08/30/2013     She has been having increased stress, her bipolar granddaughter passed while in a treatment center from overdose.   BMI is Body mass index is 25.35 kg/(m^2)., she is working on diet and exercise. Wt Readings from Last 3 Encounters:  12/11/14 132 lb (59.875 kg)  04/09/14 129 lb (58.514 kg)  12/22/13 128 lb 8 oz (58.287 kg)    Current Medications:  Current Outpatient  Prescriptions on File Prior to Visit  Medication Sig Dispense Refill  . aspirin 81 MG tablet Take 81 mg by mouth daily.      Marland Kitchen atorvastatin (LIPITOR) 80 MG tablet Take 1 tablet (80 mg total) by mouth daily. 90 tablet 1  . Calcium Carbonate-Vit D-Min (CALCIUM 1200 PO) Take by mouth.      . Flaxseed, Linseed, (FLAX SEED OIL) 1000 MG CAPS Take by mouth.      . Magnesium 250 MG TABS Take 250 mg by mouth daily.    . Multiple Vitamin (MULTIVITAMIN PO) Take by mouth.     . Omega-3 Fatty Acids (FISH OIL) 1200 MG CAPS Take by mouth.      Marland Kitchen VITAMIN D, CHOLECALCIFEROL, PO Take 5,000 mg by mouth daily.      No current facility-administered medications on file prior to visit.   Medical History:  Past Medical History  Diagnosis Date  . Hyperlipidemia   . Cancer     breast  . Hypertension   . Prediabetes   . Osteopenia   . Vitamin D deficiency   . Hyperlipemia 05/08/2013  . Hypomagnesemia 05/08/2013   Allergies:  Allergies  Allergen Reactions  . Ppd [Tuberculin Purified Protein Derivative]     Review of Systems:  Review of Systems  Constitutional: Negative.   HENT: Negative.   Eyes: Negative.   Respiratory: Negative.   Cardiovascular: Negative.   Gastrointestinal: Negative.   Genitourinary: Negative.   Musculoskeletal: Negative.  Skin: Negative.   Neurological: Negative.   Endo/Heme/Allergies: Negative.   Psychiatric/Behavioral: Negative.     Family history- Review and unchanged Social history- Review and unchanged Physical Exam: BP 130/72 mmHg  Pulse 68  Temp(Src) 97.8 F (36.6 C)  Resp 16  Ht 5' 0.5" (1.537 m)  Wt 132 lb (59.875 kg)  BMI 25.35 kg/m2 Wt Readings from Last 3 Encounters:  12/11/14 132 lb (59.875 kg)  04/09/14 129 lb (58.514 kg)  12/22/13 128 lb 8 oz (58.287 kg)   General Appearance: Well nourished, in no apparent distress. Eyes: PERRLA, EOMs, conjunctiva no swelling or erythema Sinuses: No Frontal/maxillary tenderness ENT/Mouth: Ext aud canals  clear, TMs without erythema, bulging. No erythema, swelling, or exudate on post pharynx.  Tonsils not swollen or erythematous. Hearing normal.  Neck: Supple, thyroid normal.  Respiratory: Respiratory effort normal, BS equal bilaterally without rales, rhonchi, wheezing or stridor.  Cardio: RRR with no MRGs. Brisk peripheral pulses without edema.  Abdomen: Soft, + BS,  Non tender, no guarding, rebound, hernias, masses. Lymphatics: Non tender without lymphadenopathy.  Musculoskeletal: Full ROM, 5/5 strength, Normal gait Skin: Warm, dry without rashes, lesions, ecchymosis.  Neuro: Cranial nerves intact. Normal muscle tone, no cerebellar symptoms. Psych: Awake and oriented X 3, normal affect, Insight and Judgment appropriate.    Vicie Mutters, PA-C 11:11 AM [facility]

## 2014-12-11 NOTE — Addendum Note (Signed)
Addended by: Vicie Mutters R on: 12/11/2014 12:55 PM   Modules accepted: Orders

## 2014-12-11 NOTE — Patient Instructions (Signed)
Benefiber is good for constipation/diarrhea/irritable bowel syndrome, it helps with weight loss and can help lower your bad cholesterol. Please do 1-2 TBSP in the morning in water, coffee, or tea. It can take up to a month before you can see a difference with your bowel movements. It is cheapest from [employer], [employer], [employer].   Cholesterol Cholesterol is a white, waxy, fat-like substance needed by your body in small amounts. The liver makes all the cholesterol you need. Cholesterol is carried from the liver by the blood through the blood vessels. Deposits of cholesterol (plaque) may build up on blood vessel walls. These make the arteries narrower and stiffer. Cholesterol plaques increase the risk for heart attack and stroke.  You cannot feel your cholesterol level even if it is very high. The only way to know it is high is with a blood test. Once you know your cholesterol levels, you should keep a record of the test results. Work with your health care provider to keep your levels in the desired range.  WHAT DO THE RESULTS MEAN?  Total cholesterol is a rough measure of all the cholesterol in your blood.   LDL is the so-called bad cholesterol. This is the type that deposits cholesterol in the walls of the arteries. You want this level to be low.   HDL is the good cholesterol because it cleans the arteries and carries the LDL away. You want this level to be high.  Triglycerides are fat that the body can either burn for energy or store. High levels are closely linked to heart disease.  WHAT ARE THE DESIRED LEVELS OF CHOLESTEROL?  Total cholesterol below 200.   LDL below 100 for people at risk, below 70 for those at very high risk.   HDL above 50 is good, above 60 is best.   Triglycerides below 150.  HOW CAN I LOWER MY CHOLESTEROL?  Diet. Follow your diet programs as directed by your health care provider.   Choose fish or white meat chicken and [place], roasted or baked. Limit fatty cuts  of red meat, fried foods, and processed meats, such as sausage and lunch meats.   Eat lots of fresh fruits and vegetables.  Choose whole grains, beans, pasta, potatoes, and cereals.   Use only small amounts of olive, corn, or canola oils.   Avoid butter, mayonnaise, shortening, or palm kernel oils.  Avoid foods with trans fats.   Drink skim or nonfat milk and eat low-fat or nonfat yogurt and cheeses. Avoid whole milk, cream, ice cream, egg yolks, and full-fat cheeses.   Healthy desserts include angel food cake, ginger snaps, animal crackers, hard candy, popsicles, and low-fat or nonfat frozen yogurt. Avoid pastries, cakes, pies, and cookies.   Exercise. Follow your exercise programs as directed by your health care provider.   A regular program helps decrease LDL and raise HDL.   A regular program helps with weight control.   Do things that increase your activity level like gardening, walking, or taking the stairs. Ask your health care provider about how you can be more active in your daily life.   Medicine. Take medicine only as directed by your health care provider.   Medicine may be prescribed by your health care provider to help lower cholesterol and decrease the risk for heart disease.   If you have several risk factors, you may need medicine even if your levels are normal. Document Released: 03/09/2001 Document Revised: 10/29/2013 Document Reviewed: 03/28/2013 [facility] Patient Information 2015 [place], [place].  This information is not intended to replace advice given to you by your health care provider. Make sure you discuss any questions you have with your health care provider.

## 2014-12-12 LAB — INSULIN, FASTING: INSULIN FASTING, SERUM: 8.8 u[IU]/mL (ref 2.0–19.6)

## 2014-12-19 DIAGNOSIS — M810 Age-related osteoporosis without current pathological fracture: Secondary | ICD-10-CM | POA: Diagnosis not present

## 2014-12-19 DIAGNOSIS — M8580 Other specified disorders of bone density and structure, unspecified site: Secondary | ICD-10-CM | POA: Diagnosis not present

## 2015-02-14 ENCOUNTER — Other Ambulatory Visit: Payer: Self-pay

## 2015-03-07 DIAGNOSIS — Z23 Encounter for immunization: Secondary | ICD-10-CM | POA: Diagnosis not present

## 2015-04-05 ENCOUNTER — Encounter: Payer: Self-pay | Admitting: *Deleted

## 2015-04-07 ENCOUNTER — Encounter: Payer: Self-pay | Admitting: Internal Medicine

## 2015-04-10 ENCOUNTER — Encounter: Payer: Self-pay | Admitting: Physician Assistant

## 2015-04-10 ENCOUNTER — Ambulatory Visit (INDEPENDENT_AMBULATORY_CARE_PROVIDER_SITE_OTHER): Payer: Medicare Other | Admitting: Physician Assistant

## 2015-04-10 VITALS — BP 128/66 | HR 62 | Temp 97.0°F | Resp 14 | Ht 60.0 in | Wt 129.0 lb

## 2015-04-10 DIAGNOSIS — Z6825 Body mass index (BMI) 25.0-25.9, adult: Secondary | ICD-10-CM | POA: Diagnosis not present

## 2015-04-10 DIAGNOSIS — C50911 Malignant neoplasm of unspecified site of right female breast: Secondary | ICD-10-CM | POA: Diagnosis not present

## 2015-04-10 DIAGNOSIS — Z0000 Encounter for general adult medical examination without abnormal findings: Secondary | ICD-10-CM

## 2015-04-10 DIAGNOSIS — R030 Elevated blood-pressure reading, without diagnosis of hypertension: Secondary | ICD-10-CM | POA: Diagnosis not present

## 2015-04-10 DIAGNOSIS — M8580 Other specified disorders of bone density and structure, unspecified site: Secondary | ICD-10-CM

## 2015-04-10 DIAGNOSIS — E559 Vitamin D deficiency, unspecified: Secondary | ICD-10-CM | POA: Diagnosis not present

## 2015-04-10 DIAGNOSIS — Z1159 Encounter for screening for other viral diseases: Secondary | ICD-10-CM

## 2015-04-10 DIAGNOSIS — Z79899 Other long term (current) drug therapy: Secondary | ICD-10-CM | POA: Diagnosis not present

## 2015-04-10 DIAGNOSIS — Z789 Other specified health status: Secondary | ICD-10-CM | POA: Diagnosis not present

## 2015-04-10 DIAGNOSIS — Z9181 History of falling: Secondary | ICD-10-CM

## 2015-04-10 DIAGNOSIS — R7309 Other abnormal glucose: Secondary | ICD-10-CM

## 2015-04-10 DIAGNOSIS — R6889 Other general symptoms and signs: Secondary | ICD-10-CM | POA: Diagnosis not present

## 2015-04-10 DIAGNOSIS — E785 Hyperlipidemia, unspecified: Secondary | ICD-10-CM

## 2015-04-10 DIAGNOSIS — N182 Chronic kidney disease, stage 2 (mild): Secondary | ICD-10-CM | POA: Diagnosis not present

## 2015-04-10 DIAGNOSIS — Z0001 Encounter for general adult medical examination with abnormal findings: Secondary | ICD-10-CM

## 2015-04-10 LAB — CBC WITH DIFFERENTIAL/PLATELET
BASOS PCT: 0 % (ref 0–1)
Basophils Absolute: 0 10*3/uL (ref 0.0–0.1)
EOS ABS: 0.1 10*3/uL (ref 0.0–0.7)
Eosinophils Relative: 2 % (ref 0–5)
HCT: 45.4 % (ref 36.0–46.0)
Hemoglobin: 14.9 g/dL (ref 12.0–15.0)
Lymphocytes Relative: 32 % (ref 12–46)
Lymphs Abs: 1.7 10*3/uL (ref 0.7–4.0)
MCH: 30.7 pg (ref 26.0–34.0)
MCHC: 32.8 g/dL (ref 30.0–36.0)
MCV: 93.6 fL (ref 78.0–100.0)
MONOS PCT: 9 % (ref 3–12)
MPV: 11.5 fL (ref 8.6–12.4)
Monocytes Absolute: 0.5 10*3/uL (ref 0.1–1.0)
NEUTROS PCT: 57 % (ref 43–77)
Neutro Abs: 3.1 10*3/uL (ref 1.7–7.7)
PLATELETS: 228 10*3/uL (ref 150–400)
RBC: 4.85 MIL/uL (ref 3.87–5.11)
RDW: 13.6 % (ref 11.5–15.5)
WBC: 5.4 10*3/uL (ref 4.0–10.5)

## 2015-04-10 LAB — HEPATIC FUNCTION PANEL
ALT: 23 U/L (ref 6–29)
AST: 25 U/L (ref 10–35)
Albumin: 4.3 g/dL (ref 3.6–5.1)
Alkaline Phosphatase: 56 U/L (ref 33–130)
BILIRUBIN DIRECT: 0.1 mg/dL (ref <=0.2)
BILIRUBIN TOTAL: 0.7 mg/dL (ref 0.2–1.2)
Indirect Bilirubin: 0.6 mg/dL (ref 0.2–1.2)
Total Protein: 6.5 g/dL (ref 6.1–8.1)

## 2015-04-10 LAB — BASIC METABOLIC PANEL WITH GFR
BUN: 15 mg/dL (ref 7–25)
CALCIUM: 9.2 mg/dL (ref 8.6–10.4)
CHLORIDE: 105 mmol/L (ref 98–110)
CO2: 30 mmol/L (ref 20–31)
CREATININE: 0.74 mg/dL (ref 0.60–0.93)
GFR, Est African American: >89 mL/min (ref >=60)
GFR, Est Non African American: 82 mL/min (ref >=60)
Glucose, Bld: 82 mg/dL (ref 65–99)
Potassium: 4.2 mmol/L (ref 3.5–5.3)
SODIUM: 142 mmol/L (ref 135–146)

## 2015-04-10 LAB — LIPID PANEL
CHOL/HDL RATIO: 4.9 ratio (ref <=5.0)
CHOLESTEROL: 163 mg/dL (ref 125–200)
HDL: 33 mg/dL — AB (ref >=46)
LDL Cholesterol: 105 mg/dL (ref <130)
Triglycerides: 124 mg/dL (ref <150)
VLDL: 25 mg/dL (ref <30)

## 2015-04-10 LAB — MAGNESIUM: Magnesium: 2.2 mg/dL (ref 1.5–2.5)

## 2015-04-10 LAB — TSH: TSH: 1.782 u[IU]/mL (ref 0.350–4.500)

## 2015-04-10 NOTE — Patient Instructions (Addendum)
Christina Rowe- Christina Rowe   This is your [employer] Care for Adults A healthy lifestyle and preventive care can promote health and wellness. Preventive health guidelines for women include the following key practices.  A routine yearly physical is a good way to check with your health care provider about your health and preventive screening. It is a chance to share any concerns and updates on your health and to receive a thorough exam.  Visit your dentist for a routine exam and preventive care every 6 months. Brush your teeth twice a day and floss once a day. Good oral hygiene prevents tooth decay and gum disease.  The frequency of eye exams is based on your age, health, family medical history, use of contact lenses, and other factors. Follow your health care provider's recommendations for frequency of eye exams.  Eat a healthy diet. Foods like vegetables, fruits, whole grains, low-fat dairy products, and lean protein foods contain the nutrients you need without too many calories. Decrease your intake of foods high in solid fats, added sugars, and salt. Eat the right amount of calories for you.Get information about a proper diet from your health care provider, if necessary.  Regular physical exercise is one of the most important things you can do for your health. Most adults should get at least 150 minutes of moderate-intensity exercise (any activity that increases your heart rate and causes you to sweat) each week. In addition, most adults need muscle-strengthening exercises on 2 or more days a week.  Maintain a healthy weight. The body mass index (BMI) is a screening tool to identify possible weight problems. It provides an estimate of body fat based on height and weight. Your health care provider can find your BMI and can help you achieve or maintain a healthy weight.For adults 20 years and older:  A BMI below 18.5 is considered underweight.  A BMI of 18.5 to 24.9 is normal.  A  BMI of 25 to 29.9 is considered overweight.  A BMI of 30 and above is considered obese.  Maintain normal blood lipids and cholesterol levels by exercising and minimizing your intake of saturated fat. Eat a balanced diet with plenty of fruit and vegetables. If your lipid or cholesterol levels are high, you are over 50, or you are at high risk for heart disease, you may need your cholesterol levels checked more frequently.Ongoing high lipid and cholesterol levels should be treated with medicines if diet and exercise are not working.  If you smoke, find out from your health care provider how to quit. If you do not use tobacco, do not start.  Lung cancer screening is recommended for adults aged 95-80 years who are at high risk for developing lung cancer because of a history of smoking. A yearly low-dose CT scan of the lungs is recommended for people who have at least a 30-pack-year history of smoking and are a current smoker or have quit within the past 15 years. A pack year of smoking is smoking an average of 1 pack of cigarettes a day for 1 year (for example: 1 pack a day for 30 years or 2 packs a day for 15 years). Yearly screening should continue until the smoker has stopped smoking for at least 15 years. Yearly screening should be stopped for people who develop a health problem that would prevent them from having lung cancer treatment.  Avoid use of street drugs. Do not share needles with anyone. Ask for help if you need support or  instructions about stopping the use of drugs.  High blood pressure causes heart disease and increases the risk of stroke.  Ongoing high blood pressure should be treated with medicines if weight loss and exercise do not work.  If you are 65-11 years old, ask your health care provider if you should take aspirin to prevent strokes.  Diabetes screening involves taking a blood sample to check your fasting blood sugar level. This should be done once every 3 years, after age  16, if you are within normal weight and without risk factors for diabetes. Testing should be considered at a younger age or be carried out more frequently if you are overweight and have at least 1 risk factor for diabetes.  Breast cancer screening is essential preventive care for women. You should practice "breast self-awareness." This means understanding the normal appearance and feel of your breasts and may include breast self-examination. Any changes detected, no matter how small, should be reported to a health care provider. Women in their 28s and 30s should have a clinical breast exam (CBE) by a health care provider as part of a regular health exam every 1 to 3 years. After age 80, women should have a CBE every year. Starting at age 13, women should consider having a mammogram (breast X-ray test) every year. Women who have a family history of breast cancer should talk to their health care provider about genetic screening. Women at a high risk of breast cancer should talk to their health care providers about having an MRI and a mammogram every year.  Breast cancer gene (BRCA)-related cancer risk assessment is recommended for women who have family members with BRCA-related cancers. BRCA-related cancers include breast, ovarian, tubal, and peritoneal cancers. Having family members with these cancers may be associated with an increased risk for harmful changes (mutations) in the breast cancer genes BRCA1 and BRCA2. Results of the assessment will determine the need for genetic counseling and BRCA1 and BRCA2 testing.  Routine pelvic exams to screen for cancer are no longer recommended for nonpregnant women who are considered low risk for cancer of the pelvic organs (ovaries, uterus, and vagina) and who do not have symptoms. Ask your health care provider if a screening pelvic exam is right for you.  If you have had past treatment for cervical cancer or a condition that could lead to cancer, you need Pap tests  and screening for cancer for at least 20 years after your treatment. If Pap tests have been discontinued, your risk factors (such as having a new sexual partner) need to be reassessed to determine if screening should be resumed. Some women have medical problems that increase the chance of getting cervical cancer. In these cases, your health care provider may recommend more frequent screening and Pap tests.    Colorectal cancer can be detected and often prevented. Most routine colorectal cancer screening begins at the age of 29 years and continues through age 35 years. However, your health care provider may recommend screening at an earlier age if you have risk factors for colon cancer. On a yearly basis, your health care provider may provide home test kits to check for hidden blood in the stool. Use of a small camera at the end of a tube, to directly examine the colon (sigmoidoscopy or colonoscopy), can detect the earliest forms of colorectal cancer. Talk to your health care provider about this at age 10, when routine screening begins. Direct exam of the colon should be repeated every 5-10 years  through age 25 years, unless early forms of pre-cancerous polyps or small growths are found.  Osteoporosis is a disease in which the bones lose minerals and strength with aging. This can result in serious bone fractures or breaks. The risk of osteoporosis can be identified using a bone density scan. Women ages 48 years and over and women at risk for fractures or osteoporosis should discuss screening with their health care providers. Ask your health care provider whether you should take a calcium supplement or vitamin D to reduce the rate of osteoporosis.  Menopause can be associated with physical symptoms and risks. Hormone replacement therapy is available to decrease symptoms and risks. You should talk to your health care provider about whether hormone replacement therapy is right for you.  Use sunscreen. Apply  sunscreen liberally and repeatedly throughout the day. You should seek shade when your shadow is shorter than you. Protect yourself by wearing long sleeves, pants, a wide-brimmed hat, and sunglasses year round, whenever you are outdoors.  Once a month, do a whole body skin exam, using a mirror to look at the skin on your back. Tell your health care provider of new moles, moles that have irregular borders, moles that are larger than a pencil eraser, or moles that have changed in shape or color.  Stay current with required vaccines (immunizations).  Influenza vaccine. All adults should be immunized every year.  Tetanus, diphtheria, and acellular pertussis (Td, Tdap) vaccine. Pregnant women should receive 1 dose of Tdap vaccine during each pregnancy. The dose should be obtained regardless of the length of time since the last dose. Immunization is preferred during the 27th-36th week of gestation. An adult who has not previously received Tdap or who does not know her vaccine status should receive 1 dose of Tdap. This initial dose should be followed by tetanus and diphtheria toxoids (Td) booster doses every 10 years. Adults with an unknown or incomplete history of completing a 3-dose immunization series with Td-containing vaccines should begin or complete a primary immunization series including a Tdap dose. Adults should receive a Td booster every 10 years.    Zoster vaccine. One dose is recommended for adults aged 23 years or older unless certain conditions are present.    Pneumococcal 13-valent conjugate (PCV13) vaccine. When indicated, a person who is uncertain of her immunization history and has no record of immunization should receive the PCV13 vaccine. An adult aged 43 years or older who has certain medical conditions and has not been previously immunized should receive 1 dose of PCV13 vaccine. This PCV13 should be followed with a dose of pneumococcal polysaccharide (PPSV23) vaccine. The PPSV23  vaccine dose should be obtained at least 8 weeks after the dose of PCV13 vaccine. An adult aged 73 years or older who has certain medical conditions and previously received 1 or more doses of PPSV23 vaccine should receive 1 dose of PCV13. The PCV13 vaccine dose should be obtained 1 or more years after the last PPSV23 vaccine dose.    Pneumococcal polysaccharide (PPSV23) vaccine. When PCV13 is also indicated, PCV13 should be obtained first. All adults aged 34 years and older should be immunized. An adult younger than age 35 years who has certain medical conditions should be immunized. Any person who resides in a nursing home or long-term care facility should be immunized. An adult smoker should be immunized. People with an immunocompromised condition and certain other conditions should receive both PCV13 and PPSV23 vaccines. People with human immunodeficiency virus (HIV) infection should  be immunized as soon as possible after diagnosis. Immunization during chemotherapy or radiation therapy should be avoided. Routine use of PPSV23 vaccine is not recommended for American Indians, [place] Natives, or people younger than 65 years unless there are medical conditions that require PPSV23 vaccine. When indicated, people who have unknown immunization and have no record of immunization should receive PPSV23 vaccine. One-time revaccination 5 years after the first dose of PPSV23 is recommended for people aged 19-64 years who have chronic kidney failure, nephrotic syndrome, asplenia, or immunocompromised conditions. People who received 1-2 doses of PPSV23 before age 91 years should receive another dose of PPSV23 vaccine at age 60 years or later if at least 5 years have passed since the previous dose. Doses of PPSV23 are not needed for people immunized with PPSV23 at or after age 30 years.   Preventive Services / Frequency  Ages 98 years and over  Blood pressure check.  Lipid and cholesterol check.  Lung cancer  screening. / Every year if you are aged 19-80 years and have a 30-pack-year history of smoking and currently smoke or have quit within the past 15 years. Yearly screening is stopped once you have quit smoking for at least 15 years or develop a health problem that would prevent you from having lung cancer treatment.  Clinical breast exam.** / Every year after age 2 years.  BRCA-related cancer risk assessment.** / For women who have family members with a BRCA-related cancer (breast, ovarian, tubal, or peritoneal cancers).  Mammogram.** / Every year beginning at age 60 years and continuing for as long as you are in good health. Consult with your health care provider.  Pap test.** / Every 3 years starting at age 6 years through age 84 or 35 years with 3 consecutive normal Pap tests. Testing can be stopped between 65 and 70 years with 3 consecutive normal Pap tests and no abnormal Pap or HPV tests in the past 10 years.  Fecal occult blood test (FOBT) of stool. / Every year beginning at age 65 years and continuing until age 63 years. You may not need to do this test if you get a colonoscopy every 10 years.  Flexible sigmoidoscopy or colonoscopy.** / Every 5 years for a flexible sigmoidoscopy or every 10 years for a colonoscopy beginning at age 87 years and continuing until age 41 years.  Hepatitis C blood test.** / For all people born from [birth year] through 1967 and any individual with known risks for hepatitis C.  Osteoporosis screening.** / A one-time screening for women ages 57 years and over and women at risk for fractures or osteoporosis.  Skin self-exam. / Monthly.  Influenza vaccine. / Every year.  Tetanus, diphtheria, and acellular pertussis (Tdap/Td) vaccine.** / 1 dose of Td every 10 years.  Zoster vaccine.** / 1 dose for adults aged 88 years or older.  Pneumococcal 13-valent conjugate (PCV13) vaccine.** / Consult your health care provider.  Pneumococcal polysaccharide (PPSV23)  vaccine.** / 1 dose for all adults aged 56 years and older. Screening for abdominal aortic aneurysm (AAA)  by ultrasound is recommended for people who have history of high blood pressure or who are current or former smokers.

## 2015-04-10 NOTE — Progress Notes (Signed)
MEDICARE ANNUAL WELLNESS VISIT AND FOLLOW UP  Assessment:   1. Essential hypertension - CBC with Differential - BASIC METABOLIC PANEL WITH GFR - Hepatic function panel - TSH - Urinalysis, Routine w reflex microscopic - Microalbumin / creatinine urine ratio - EKG 12-Lead  2. Prediabetes Discussed general issues about diabetes pathophysiology and management., Educational material distributed., Suggested low cholesterol diet., Encouraged aerobic exercise., Discussed foot care., Reminded to get yearly retinal exam. - Hemoglobin A1c - Insulin, fasting  3. Hyperlipidemia - Lipid panel  4. Vitamin D deficiency  5. Hypomagnesemia Check Mag  6. Encounter for long-term current use of medication  7. Breast cancer, right Continue yearly MGM and follow up Dr. Marlou Starks  8. Osteopenia Will get DEXA 2018, Ca, Vitamin D, weight bearing exercises.   9. CKD (chronic kidney disease) stage 2, GFR 60-89 ml/min Check BMP   Plan:   During the course of the visit the patient was educated and counseled about appropriate screening and preventive services including:    Pneumococcal vaccine   Influenza vaccine  Td vaccine  Screening electrocardiogram  Bone densitometry screening  Colorectal cancer screening  Diabetes screening  Glaucoma screening  Nutrition counseling   Advanced directives: requested  Conditions/risks identified: BMI: Discussed weight loss, diet, and increase physical activity.  Increase physical activity: AHA recommends 150 minutes of physical activity a week.  Medications reviewed Urinary Incontinence is not an issue: discussed non pharmacology and pharmacology options.  Fall risk: low- discussed PT, home fall assessment, medications.    Subjective:  Christina Rowe is a 69 y.o. female who presents for Medicare Annual Wellness Visit and complete physical.  Date of last medicare wellness visit was 03/2014   Her blood pressure has been controlled at home, today  their BP is BP: 128/66 mmHg  She does workout, walks to work, and does yardwork. She denies chest pain, shortness of breath, dizziness.  She is on cholesterol medication, lipitor 80 1/2 QOD, and denies myalgias. Her cholesterol is at goal. The cholesterol last visit was:   Lab Results  Component Value Date   CHOL 153 12/11/2014   HDL 35* 12/11/2014   LDLCALC 100* 12/11/2014   TRIG 89 12/11/2014   CHOLHDL 4.4 12/11/2014    Last A1C in the office was:  Lab Results  Component Value Date   HGBA1C 5.8* 12/11/2014   Patient is on Vitamin D supplement.   Lab Results  Component Value Date   VD25OH 78 08/30/2013     She had a right breast cancer s/p lumpectomy in 1996, gets yearly MGM's at OB/GYN.  She has history of osteopenia, last DEXA 2016.  She takes care of her sick, biopolar daughter (had amputation) and grand daughter passed in June.  She teaches at [employer], history.   Names of Other Physician/Practitioners you currently use: 1. [facility] here for primary care 2. Dr. Gershon Crane, eye doctor, last visit May 2016 3. Dr. Ennis Forts, dentist, last visit q 6 months Patient Care Team: Unk Pinto, MD as PCP - General (Internal Medicine) Clarene Essex, MD as Consulting Physician (Gastroenterology) Autumn Messing III, MD as Consulting Physician (General Surgery) Ardath Sax, MD as Referring Physician (Obstetrics and Gynecology)   Medication Review: Current Outpatient Prescriptions on File Prior to Visit  Medication Sig Dispense Refill  . aspirin 81 MG tablet Take 81 mg by mouth daily.      Marland Kitchen atorvastatin (LIPITOR) 80 MG tablet Take 1 tablet (80 mg total) by mouth daily. 90 tablet  1  . Calcium Carbonate-Vit D-Min (CALCIUM 1200 PO) Take by mouth.      . Flaxseed, Linseed, (FLAX SEED OIL) 1000 MG CAPS Take by mouth.      . Magnesium 250 MG TABS Take 250 mg by mouth daily.    . Multiple Vitamin (MULTIVITAMIN PO) Take by mouth.     . Omega-3 Fatty Acids  (FISH OIL) 1200 MG CAPS Take by mouth.      Marland Kitchen VITAMIN D, CHOLECALCIFEROL, PO Take 5,000 mg by mouth daily.      No current facility-administered medications on file prior to visit.    Current Problems (verified) Patient Active Problem List   Diagnosis Date Noted  . Osteopenia 04/09/2014  . CKD (chronic kidney disease) stage 2, GFR 60-89 ml/min 04/09/2014  . Encounter for long-term current use of medication 08/30/2013  . Hypomagnesemia 05/08/2013  . Prediabetes   . Hyperlipidemia   . Hypertension   . Vitamin D deficiency   . Breast cancer ([place]) 06/07/2011    Screening Tests Health Maintenance  Topic Date Due  . Hepatitis C Screening  Jan 22, 1946  . INFLUENZA VACCINE  01/27/2015  . PNA vac Low Risk Adult (2 of 2 - PPSV23) 04/10/2015  . MAMMOGRAM  08/22/2016  . TETANUS/TDAP  07/07/2017  . COLONOSCOPY  05/08/2019  . DEXA SCAN  Completed  . ZOSTAVAX  Completed    Immunization History  Administered Date(s) Administered  . Influenza-Unspecified 03/05/2014  . Pneumococcal Conjugate-13 04/09/2014  . Pneumococcal-Unspecified 07/07/2008  . Td 07/08/2007  . Zoster 07/08/2007    Preventative care: Last colonoscopy: 2010 Last mammogram: 07/2014, 3D at [place], Cat A and follows with surgeon Last pap smear/pelvic exam: 2013, declines another DEXA: 11/2014 at [facility], -2.0, osteopenia CXR 2009  Prior vaccinations: TD or Tdap: 2009  Influenza: 2016 TODAY Pneumococcal: 2010 Prevnar: 2015 Shingles/Zostavax: 2009  Allergies Allergies  Allergen Reactions  . Ppd [Tuberculin Purified Protein Derivative]   . Tylenol [Acetaminophen] Other (See Comments)    Hepatitis/increase LFTs   Surgical history Past Surgical History  Procedure Laterality Date  . Tonsillectomy and adenoidectomy    . Breast lumpectomy Right 10/96    right lumpectomy and axlnd  . Dilation and curettage of uterus      X 2 while on Tamoxifen   Family history Family History  Problem Relation Age of Onset  .  COPD Mother   . Stroke Father     small strokes    Tobacco Social History  Substance Use Topics  . Smoking status: Never Smoker   . Smokeless tobacco: Never Used  . Alcohol Use: No   MEDICARE WELLNESS OBJECTIVES: Tobacco use: She does not smoke.  Patient is not a former smoker. Alcohol Current alcohol use: none Osteoporosis: postmenopausal estrogen deficiency and dietary calcium and/or vitamin D deficiency, History of fracture in the past year: no Fall risk: Low Risk Hearing: normal Visual acuity: impaired,  does perform annual eye exam Diet: in general, a "healthy" diet   Physical activity: Current Exercise Habits:: Home exercise routine, Type of exercise: walking, Time (Minutes): 20, Frequency (Times/Week): 4, Weekly Exercise (Minutes/Week): 80, Intensity: Mild Cardiac risk factors: Cardiac Risk Factors include: advanced age (>5men, >24 women) Depression/mood screen:   Depression screen [facility] 2/9 04/10/2015  Decreased Interest 0  Down, Depressed, Hopeless 0  PHQ - 2 Score 0    ADLs:  In your present state of health, do you have any difficulty performing the following activities: 04/10/2015  Hearing? N  Vision? Darreld Mclean  Difficulty concentrating or making decisions? N  Walking or climbing stairs? N  Dressing or bathing? N  Doing errands, shopping? N  Preparing Food and eating ? N  Using the Toilet? N  In the past six months, have you accidently leaked urine? N  Do you have problems with loss of bowel control? N  Managing your Medications? N  Managing your Finances? N  Housekeeping or managing your Housekeeping? N     Cognitive Testing  Alert? Yes  Normal Appearance?Yes  Oriented to person? Yes  Place? Yes   Time? Yes  Recall of three objects?  Yes  Can perform simple calculations? Yes  Displays appropriate judgment?Yes  Can read the correct time from a watch face?Yes  EOL planning: Does patient have an advance directive?: Yes Type of Advance Directive: Healthcare Power  of Attorney, Living will Does patient want to make changes to advanced directive?: No - Patient declined Copy of advanced directive(s) in chart?: No - copy requested  Review of Systems  Constitutional: Negative.   HENT: Negative.   Eyes: Negative.   Respiratory: Negative.   Cardiovascular: Negative.   Gastrointestinal: Negative.   Genitourinary: Negative.   Musculoskeletal: Negative.   Skin: Negative.   Neurological: Negative.   Endo/Heme/Allergies: Negative.   Psychiatric/Behavioral: Negative.      Objective:     Blood pressure 128/66, pulse 62, temperature 97 F (36.1 C), temperature source Temporal, resp. rate 14, height 5' (1.524 m), weight 129 lb (58.514 kg), SpO2 88 %. Body mass index is 25.19 kg/(m^2).  General appearance: alert, no distress, WD/WN, female HEENT: normocephalic, sclerae anicteric, TMs pearly, nares patent, no discharge or erythema, pharynx normal Oral cavity: MMM, no lesions Neck: supple, no lymphadenopathy, no thyromegaly, no masses Heart: RRR, normal S1, S2, no murmurs Lungs: CTA bilaterally, no wheezes, rhonchi, or rales Abdomen: +bs, soft, non tender, non distended, no masses, no hepatomegaly, no splenomegaly Musculoskeletal: nontender, no swelling, no obvious deformity Extremities: no edema, no cyanosis, no clubbing Pulses: 2+ symmetric, upper and lower extremities, normal cap refill Neurological: alert, oriented x 3, CN2-12 intact, strength normal upper extremities and lower extremities, sensation normal throughout, DTRs 2+ throughout, no cerebellar signs, gait normal Psychiatric: normal affect, behavior normal, pleasant   Medicare Attestation I have personally reviewed: The patient's medical and social history Their use of alcohol, tobacco or illicit drugs Their current medications and supplements The patient's functional ability including ADLs,fall risks, home safety risks, cognitive, and hearing and visual impairment Diet and physical  activities Evidence for depression or mood disorders  The patient's weight, height, BMI, and visual acuity have been recorded in the chart.  I have made referrals, counseling, and provided education to the patient based on review of the above and I have provided the patient with a written personalized care plan for preventive services.     Vicie Mutters, PA-C   04/10/2015

## 2015-04-11 LAB — URINALYSIS, MICROSCOPIC ONLY
BACTERIA UA: NONE SEEN [HPF]
CASTS: NONE SEEN [LPF]
Crystals: NONE SEEN [HPF]
RBC / HPF: NONE SEEN RBC/HPF (ref <=2)
SQUAMOUS EPITHELIAL / LPF: NONE SEEN [HPF] (ref <=5)
YEAST: NONE SEEN [HPF]

## 2015-04-11 LAB — URINALYSIS, ROUTINE W REFLEX MICROSCOPIC
Bilirubin Urine: NEGATIVE
Glucose, UA: NEGATIVE
Hgb urine dipstick: NEGATIVE
Ketones, ur: NEGATIVE
NITRITE: NEGATIVE
PH: 5 (ref 5.0–8.0)
Protein, ur: NEGATIVE
SPECIFIC GRAVITY, URINE: 1.02 (ref 1.001–1.035)

## 2015-04-11 LAB — MICROALBUMIN / CREATININE URINE RATIO
CREATININE, URINE: 122.2 mg/dL
Microalb Creat Ratio: 4.1 mg/g (ref 0.0–30.0)
Microalb, Ur: 0.5 mg/dL (ref <2.0)

## 2015-04-11 LAB — HEMOGLOBIN A1C
Hgb A1c MFr Bld: 5.8 % — ABNORMAL HIGH (ref <5.7)
Mean Plasma Glucose: 120 mg/dL — ABNORMAL HIGH (ref <117)

## 2015-04-29 ENCOUNTER — Encounter: Payer: Self-pay | Admitting: Physician Assistant

## 2015-08-21 DIAGNOSIS — Z1231 Encounter for screening mammogram for malignant neoplasm of breast: Secondary | ICD-10-CM | POA: Diagnosis not present

## 2015-08-21 DIAGNOSIS — Z853 Personal history of malignant neoplasm of breast: Secondary | ICD-10-CM | POA: Diagnosis not present

## 2015-10-22 ENCOUNTER — Other Ambulatory Visit: Payer: Self-pay | Admitting: Internal Medicine

## 2015-10-22 ENCOUNTER — Encounter: Payer: Self-pay | Admitting: Internal Medicine

## 2015-10-22 ENCOUNTER — Ambulatory Visit (INDEPENDENT_AMBULATORY_CARE_PROVIDER_SITE_OTHER): Payer: Medicare Other | Admitting: Internal Medicine

## 2015-10-22 VITALS — BP 118/78 | HR 76 | Temp 97.5°F | Resp 16 | Ht 60.0 in | Wt 128.6 lb

## 2015-10-22 DIAGNOSIS — R7303 Prediabetes: Secondary | ICD-10-CM | POA: Diagnosis not present

## 2015-10-22 DIAGNOSIS — Z79899 Other long term (current) drug therapy: Secondary | ICD-10-CM

## 2015-10-22 DIAGNOSIS — R030 Elevated blood-pressure reading, without diagnosis of hypertension: Secondary | ICD-10-CM

## 2015-10-22 DIAGNOSIS — E785 Hyperlipidemia, unspecified: Secondary | ICD-10-CM | POA: Diagnosis not present

## 2015-10-22 DIAGNOSIS — E559 Vitamin D deficiency, unspecified: Secondary | ICD-10-CM

## 2015-10-22 DIAGNOSIS — R7309 Other abnormal glucose: Secondary | ICD-10-CM | POA: Diagnosis not present

## 2015-10-22 LAB — HEPATIC FUNCTION PANEL
ALT: 21 U/L (ref 6–29)
AST: 18 U/L (ref 10–35)
Albumin: 4.1 g/dL (ref 3.6–5.1)
Alkaline Phosphatase: 56 U/L (ref 33–130)
BILIRUBIN DIRECT: 0.1 mg/dL (ref <=0.2)
BILIRUBIN INDIRECT: 0.5 mg/dL (ref 0.2–1.2)
BILIRUBIN TOTAL: 0.6 mg/dL (ref 0.2–1.2)
Total Protein: 6.7 g/dL (ref 6.1–8.1)

## 2015-10-22 LAB — CBC WITH DIFFERENTIAL/PLATELET
BASOS PCT: 1 %
Basophils Absolute: 51 cells/uL (ref 0–200)
EOS ABS: 51 {cells}/uL (ref 15–500)
EOS PCT: 1 %
HCT: 44.5 % (ref 35.0–45.0)
Hemoglobin: 14.7 g/dL (ref 11.7–15.5)
LYMPHS ABS: 1887 {cells}/uL (ref 850–3900)
LYMPHS PCT: 37 %
MCH: 30.8 pg (ref 27.0–33.0)
MCHC: 33 g/dL (ref 32.0–36.0)
MCV: 93.1 fL (ref 80.0–100.0)
MONO ABS: 357 {cells}/uL (ref 200–950)
MPV: 11 fL (ref 7.5–12.5)
Monocytes Relative: 7 %
NEUTROS ABS: 2754 {cells}/uL (ref 1500–7800)
Neutrophils Relative %: 54 %
Platelets: 237 10*3/uL (ref 140–400)
RBC: 4.78 MIL/uL (ref 3.80–5.10)
RDW: 13.4 % (ref 11.0–15.0)
WBC: 5.1 10*3/uL (ref 3.8–10.8)

## 2015-10-22 LAB — HEMOGLOBIN A1C
Hgb A1c MFr Bld: 5.6 % (ref <5.7)
MEAN PLASMA GLUCOSE: 114 mg/dL

## 2015-10-22 LAB — LIPID PANEL
CHOL/HDL RATIO: 5.3 ratio — AB (ref <=5.0)
CHOLESTEROL: 164 mg/dL (ref 125–200)
HDL: 31 mg/dL — AB (ref >=46)
LDL Cholesterol: 107 mg/dL (ref <130)
TRIGLYCERIDES: 132 mg/dL (ref <150)
VLDL: 26 mg/dL (ref <30)

## 2015-10-22 LAB — BASIC METABOLIC PANEL WITH GFR
BUN: 15 mg/dL (ref 7–25)
CALCIUM: 9.2 mg/dL (ref 8.6–10.4)
CO2: 28 mmol/L (ref 20–31)
Chloride: 103 mmol/L (ref 98–110)
Creat: 0.75 mg/dL (ref 0.60–0.93)
GFR, EST NON AFRICAN AMERICAN: 80 mL/min (ref >=60)
Glucose, Bld: 83 mg/dL (ref 65–99)
Potassium: 4.2 mmol/L (ref 3.5–5.3)
SODIUM: 140 mmol/L (ref 135–146)

## 2015-10-22 LAB — TSH: TSH: 1.8 mIU/L

## 2015-10-22 LAB — MAGNESIUM: Magnesium: 2 mg/dL (ref 1.5–2.5)

## 2015-10-22 NOTE — Progress Notes (Signed)
Patient ID: Christina Rowe, female   DOB: 07-Feb-1946, 70 y.o.   MRN: [id number]   This very nice 70 y.o. DWF presents for  follow up with Hypertension, Hyperlipidemia, Pre-Diabetes and Vitamin D Deficiency.    Patient is followed expectantly for HTN & BP has been controlled and today's BP: 118/78 mmHg. Patient has had no complaints of any cardiac type chest pain, palpitations, dyspnea/orthopnea/PND, dizziness, claudication, or dependent edema.   Hyperlipidemia is near controlled with diet & meds. Patient denies myalgias or other med SE's. Last Lipids were near goal with Cholesterol 163; HDL 33*; LDL 105; Triglycerides 124 on 04/10/2015.    Also, the patient has history of abnormal elevated glucose and has been followed expectantly for prediabetes and has had no symptoms of reactive hypoglycemia, diabetic polys, paresthesias or visual blurring.  Last A1c was  5.8% on10/13/2016.     Further, the patient also has history of Vitamin D Deficiency of "38" on treatment in 2014 and supplements vitamin D without any suspected side-effects. Last vitamin D was 78 in Mar 2016.     Medication Sig  . aspirin 81 MG tablet Take 81 mg by mouth daily.    Marland Kitchen atorvastatin  80 MG tablet Take 1 tablet (80 mg total) by mouth daily.  . Calcium-Vit D-Min 1200 mg Take by mouth.    Marland Kitchen FLAX SEED OIL 1000 MG Take by mouth.    . Magnesium 250 MG  Take 250 mg by mouth daily.  . Multiple Vitamin  Take by mouth.   . Omega-3 FISH OIL 1200 MG  Take by mouth.    Marland Kitchen VITAMIN D Take 5,000 mg by mouth daily.    Allergies  Allergen Reactions  . Ppd [Tuberculin Purified Protein Derivative]   . Tylenol [Acetaminophen] Other (See Comments)    Hepatitis/increase LFTs   PMHx:   Past Medical History  Diagnosis Date  . Hyperlipidemia   . Cancer (H[facility])     breast  . Hypertension   . Prediabetes   . Osteopenia   . Vitamin D deficiency   . Hyperlipemia 05/08/2013  . Hypomagnesemia 05/08/2013   Immunization History  Administered  Date(s) Administered  . Influenza-Unspecified 03/05/2014  . Pneumococcal Conjugate-13 04/09/2014  . Pneumococcal-Unspecified 07/07/2008  . Td 07/08/2007  . Zoster 07/08/2007   Past Surgical History  Procedure Laterality Date  . Tonsillectomy and adenoidectomy    . Breast lumpectomy Right 10/96    right lumpectomy and axlnd  . Dilation and curettage of uterus      X 2 while on Tamoxifen   FHx:    Reviewed / unchanged  SHx:    Reviewed / unchanged  Systems Review:  Constitutional: Denies fever, chills, wt changes, headaches, insomnia, fatigue, night sweats, change in appetite. Eyes: Denies redness, blurred vision, diplopia, discharge, itchy, watery eyes.  ENT: Denies discharge, congestion, post nasal drip, epistaxis, sore throat, earache, hearing loss, dental pain, tinnitus, vertigo, sinus pain, snoring.  CV: Denies chest pain, palpitations, irregular heartbeat, syncope, dyspnea, diaphoresis, orthopnea, PND, claudication or edema. Respiratory: denies cough, dyspnea, DOE, pleurisy, hoarseness, laryngitis, wheezing.  Gastrointestinal: Denies dysphagia, odynophagia, heartburn, reflux, water brash, abdominal pain or cramps, nausea, vomiting, bloating, diarrhea, constipation, hematemesis, melena, hematochezia  or hemorrhoids. Genitourinary: Denies dysuria, frequency, urgency, nocturia, hesitancy, discharge, hematuria or flank pain. Musculoskeletal: Denies arthralgias, myalgias, stiffness, jt. swelling, pain, limping or strain/sprain.  Skin: Denies pruritus, rash, hives, warts, acne, eczema or change in skin lesion(s). Neuro: No weakness, tremor, incoordination, spasms, paresthesia or pain.  Psychiatric: Denies confusion, memory loss or sensory loss. Endo: Denies change in weight, skin or hair change.  Heme/Lymph: No excessive bleeding, bruising or enlarged lymph nodes.  Physical Exam  BP 118/78 mmHg  Pulse 76  Temp(Src) 97.5 F (36.4 C)  Resp 16  Ht 5' (1.524 m)  Wt 128 lb 9.6 oz  (58.333 kg)  BMI 25.12 kg/m2  Appears well nourished and in no distress. Eyes: PERRLA, EOMs, conjunctiva no swelling or erythema. Sinuses: No frontal/maxillary tenderness ENT/Mouth: EAC's clear, TM's nl w/o erythema, bulging. Nares clear w/o erythema, swelling, exudates. Oropharynx clear without erythema or exudates. Oral hygiene is good. Tongue normal, non obstructing. Hearing intact.  Neck: Supple. Thyroid nl. Car 2+/2+ without bruits, nodes or JVD. Chest: Respirations nl with BS clear & equal w/o rales, rhonchi, wheezing or stridor.  Cor: Heart sounds normal w/ regular rate and rhythm without sig. murmurs, gallops, clicks, or rubs. Peripheral pulses normal and equal  without edema.  Abdomen: Soft & bowel sounds normal. Non-tender w/o guarding, rebound, hernias, masses, or organomegaly.  Lymphatics: Unremarkable.  Musculoskeletal: Full ROM all peripheral extremities, joint stability, 5/5 strength, and normal gait.  Skin: Warm, dry without exposed rashes, lesions or ecchymosis apparent.  Neuro: Cranial nerves intact, reflexes equal bilaterally. Sensory-motor testing grossly intact. Tendon reflexes grossly intact.  Pysch: Alert & oriented x 3.  Insight and judgement nl & appropriate. No ideations.  Assessment and Plan:  1. Elevated blood pressure reading without diagnosis of hypertension  - TSH  2. Hyperlipidemia  - Lipid panel - TSH  3. Prediabetes  - Hemoglobin A1c - Insulin, random  4. Vitamin D deficiency  - VITAMIN D 25 Hydroxy  5. Medication management  - CBC with Differential/Platelet - BASIC METABOLIC PANEL WITH GFR - Hepatic function panel - Magnesium   Recommended regular exercise, BP monitoring, weight control, and discussed med and SE's. Recommended labs to assess and monitor clinical status. Further disposition pending results of labs. Over 30 minutes of exam, counseling, chart review was performed

## 2015-10-22 NOTE — Patient Instructions (Signed)

## 2015-10-23 LAB — INSULIN, RANDOM: Insulin: 6.9 u[IU]/mL (ref 2.0–19.6)

## 2015-10-24 LAB — VITAMIN D 25 HYDROXY (VIT D DEFICIENCY, FRACTURES): Vit D, 25-Hydroxy: 45 ng/mL (ref 30–100)

## 2015-11-11 ENCOUNTER — Other Ambulatory Visit: Payer: Self-pay | Admitting: Internal Medicine

## 2015-11-16 ENCOUNTER — Encounter: Payer: Self-pay | Admitting: *Deleted

## 2015-11-27 DIAGNOSIS — D225 Melanocytic nevi of trunk: Secondary | ICD-10-CM | POA: Diagnosis not present

## 2015-11-27 DIAGNOSIS — D1801 Hemangioma of skin and subcutaneous tissue: Secondary | ICD-10-CM | POA: Diagnosis not present

## 2015-11-27 DIAGNOSIS — D485 Neoplasm of uncertain behavior of skin: Secondary | ICD-10-CM | POA: Diagnosis not present

## 2015-11-27 DIAGNOSIS — Z85828 Personal history of other malignant neoplasm of skin: Secondary | ICD-10-CM | POA: Diagnosis not present

## 2015-11-27 DIAGNOSIS — L570 Actinic keratosis: Secondary | ICD-10-CM | POA: Diagnosis not present

## 2015-11-27 DIAGNOSIS — L812 Freckles: Secondary | ICD-10-CM | POA: Diagnosis not present

## 2015-11-27 DIAGNOSIS — D0461 Carcinoma in situ of skin of right upper limb, including shoulder: Secondary | ICD-10-CM | POA: Diagnosis not present

## 2015-11-27 DIAGNOSIS — L821 Other seborrheic keratosis: Secondary | ICD-10-CM | POA: Diagnosis not present

## 2015-12-17 DIAGNOSIS — L570 Actinic keratosis: Secondary | ICD-10-CM | POA: Diagnosis not present

## 2015-12-17 DIAGNOSIS — D0461 Carcinoma in situ of skin of right upper limb, including shoulder: Secondary | ICD-10-CM | POA: Diagnosis not present

## 2016-01-23 ENCOUNTER — Encounter: Payer: Self-pay | Admitting: Internal Medicine

## 2016-01-23 ENCOUNTER — Ambulatory Visit (INDEPENDENT_AMBULATORY_CARE_PROVIDER_SITE_OTHER): Payer: Medicare Other | Admitting: Internal Medicine

## 2016-01-23 VITALS — BP 122/60 | HR 58 | Temp 98.0°F | Resp 16 | Ht 60.0 in | Wt 128.0 lb

## 2016-01-23 DIAGNOSIS — Z79899 Other long term (current) drug therapy: Secondary | ICD-10-CM

## 2016-01-23 DIAGNOSIS — E785 Hyperlipidemia, unspecified: Secondary | ICD-10-CM

## 2016-01-23 LAB — CBC WITH DIFFERENTIAL/PLATELET
BASOS PCT: 1 %
Basophils Absolute: 48 cells/uL (ref 0–200)
EOS PCT: 2 %
Eosinophils Absolute: 96 cells/uL (ref 15–500)
HCT: 44.9 % (ref 35.0–45.0)
Hemoglobin: 15 g/dL (ref 11.7–15.5)
LYMPHS PCT: 38 %
Lymphs Abs: 1824 cells/uL (ref 850–3900)
MCH: 31.4 pg (ref 27.0–33.0)
MCHC: 33.4 g/dL (ref 32.0–36.0)
MCV: 94.1 fL (ref 80.0–100.0)
MONOS PCT: 6 %
MPV: 11.1 fL (ref 7.5–12.5)
Monocytes Absolute: 288 cells/uL (ref 200–950)
Neutro Abs: 2544 cells/uL (ref 1500–7800)
Neutrophils Relative %: 53 %
PLATELETS: 221 10*3/uL (ref 140–400)
RBC: 4.77 MIL/uL (ref 3.80–5.10)
RDW: 13.4 % (ref 11.0–15.0)
WBC: 4.8 10*3/uL (ref 3.8–10.8)

## 2016-01-23 LAB — HEPATIC FUNCTION PANEL
ALBUMIN: 4.2 g/dL (ref 3.6–5.1)
ALK PHOS: 50 U/L (ref 33–130)
ALT: 19 U/L (ref 6–29)
AST: 21 U/L (ref 10–35)
BILIRUBIN INDIRECT: 0.4 mg/dL (ref 0.2–1.2)
BILIRUBIN TOTAL: 0.5 mg/dL (ref 0.2–1.2)
Bilirubin, Direct: 0.1 mg/dL (ref <=0.2)
Total Protein: 6.6 g/dL (ref 6.1–8.1)

## 2016-01-23 LAB — BASIC METABOLIC PANEL WITH GFR
BUN: 15 mg/dL (ref 7–25)
CO2: 26 mmol/L (ref 20–31)
Calcium: 9.3 mg/dL (ref 8.6–10.4)
Chloride: 105 mmol/L (ref 98–110)
Creat: 0.84 mg/dL (ref 0.60–0.93)
GFR, EST AFRICAN AMERICAN: 80 mL/min (ref >=60)
GFR, EST NON AFRICAN AMERICAN: 70 mL/min (ref >=60)
Glucose, Bld: 88 mg/dL (ref 65–99)
POTASSIUM: 4.7 mmol/L (ref 3.5–5.3)
Sodium: 142 mmol/L (ref 135–146)

## 2016-01-23 LAB — LIPID PANEL
CHOL/HDL RATIO: 4.9 ratio (ref <=5.0)
Cholesterol: 167 mg/dL (ref 125–200)
HDL: 34 mg/dL — AB (ref >=46)
LDL Cholesterol: 107 mg/dL (ref <130)
Triglycerides: 132 mg/dL (ref <150)
VLDL: 26 mg/dL (ref <30)

## 2016-01-23 NOTE — Progress Notes (Signed)
Assessment and Plan:   Cholesterol: -Continue diet and exercise.  -Check cholesterol.   Pre-diabetes: -Continue diet and exercise.  -well controlled with diet  Vitamin D Def: -continue medications.   Continue diet and meds as discussed. Further disposition pending results of labs.  HPI 70 y.o. female  presents for 3 month follow up with hypertension, hyperlipidemia, prediabetes and vitamin D.   Her blood pressure has been controlled at home, today their BP is BP: 122/60.   She does workout. She denies chest pain, shortness of breath, dizziness.   She is on cholesterol medication and denies myalgias. Her cholesterol is at goal. The cholesterol last visit was:   Lab Results  Component Value Date   CHOL 164 10/22/2015   HDL 31 (L) 10/22/2015   LDLCALC 107 10/22/2015   TRIG 132 10/22/2015   CHOLHDL 5.3 (H) 10/22/2015     She has been working on diet and exercise for prediabetes, and denies foot ulcerations, hyperglycemia, hypoglycemia , increased appetite, nausea, paresthesia of the feet, polydipsia, polyuria, visual disturbances, vomiting and weight loss. Last A1C in the office was:  Lab Results  Component Value Date   HGBA1C 5.6 10/22/2015    Patient is on Vitamin D supplement.  Lab Results  Component Value Date   VD25OH 45 10/22/2015      Current Medications:  Current Outpatient Prescriptions on File Prior to Visit  Medication Sig Dispense Refill  . aspirin 81 MG tablet Take 81 mg by mouth daily.      Marland Kitchen atorvastatin (LIPITOR) 80 MG tablet TAKE ONE TABLET BY MOUTH ONCE DAILY 90 tablet 0  . Flaxseed, Linseed, (FLAX SEED OIL) 1000 MG CAPS Take by mouth.      . Magnesium 250 MG TABS Take 250 mg by mouth daily.    . Multiple Vitamin (MULTIVITAMIN PO) Take by mouth.     . Omega-3 Fatty Acids (FISH OIL) 1200 MG CAPS Take by mouth.      Marland Kitchen VITAMIN D, CHOLECALCIFEROL, PO Take 5,000 mg by mouth daily.      No current facility-administered medications on file prior to visit.      Medical History:  Past Medical History:  Diagnosis Date  . Cancer ([place])    breast  . Hyperlipemia 05/08/2013  . Hyperlipidemia   . Hypertension   . Hypomagnesemia 05/08/2013  . Osteopenia   . Prediabetes   . Vitamin D deficiency     Allergies:  Allergies  Allergen Reactions  . Ppd [Tuberculin Purified Protein Derivative]   . Tylenol [Acetaminophen] Other (See Comments)    Hepatitis/increase LFTs     Review of Systems:  Review of Systems  Constitutional: Negative for chills, fever and malaise/fatigue.  HENT: Negative for congestion, ear pain and sore throat.   Eyes: Negative.   Respiratory: Negative for cough, shortness of breath and wheezing.   Cardiovascular: Negative for chest pain, palpitations and leg swelling.  Gastrointestinal: Negative for abdominal pain, blood in stool, constipation, diarrhea, heartburn and melena.  Genitourinary: Negative.   Skin: Negative.   Neurological: Negative for dizziness, sensory change, loss of consciousness and headaches.  Psychiatric/Behavioral: Negative for depression. The patient is not nervous/anxious and does not have insomnia.     Family history- Review and unchanged  Social history- Review and unchanged  Physical Exam: BP 122/60   Pulse (!) 58   Temp 98 F (36.7 C) (Temporal)   Resp 16   Ht 5' (1.524 m)   Wt 128 lb (58.1 kg)  BMI 25.00 kg/m  Wt Readings from Last 3 Encounters:  01/23/16 128 lb (58.1 kg)  10/22/15 128 lb 9.6 oz (58.3 kg)  04/10/15 129 lb (58.5 kg)    General Appearance: Well nourished well developed, in no apparent distress. Eyes: PERRLA, EOMs, conjunctiva no swelling or erythema ENT/Mouth: Ear canals normal without obstruction, swelling, erythma, discharge.  TMs normal bilaterally.  Oropharynx moist, clear, without exudate, or postoropharyngeal swelling. Neck: Supple, thyroid normal,no cervical adenopathy  Respiratory: Respiratory effort normal, Breath sounds clear A&P without rhonchi,  wheeze, or rale.  No retractions, no accessory usage. Cardio: RRR with no MRGs. Brisk peripheral pulses without edema.  Abdomen: Soft, + BS,  Non tender, no guarding, rebound, hernias, masses. Musculoskeletal: Full ROM, 5/5 strength, Normal gait Skin: Warm, dry without rashes, lesions, ecchymosis.  Neuro: Awake and oriented X 3, Cranial nerves intact. Normal muscle tone, no cerebellar symptoms. Psych: Normal affect, Insight and Judgment appropriate.    Starlyn Skeans, PA-C 9:01 AM [facility]

## 2016-02-26 DIAGNOSIS — H2513 Age-related nuclear cataract, bilateral: Secondary | ICD-10-CM | POA: Diagnosis not present

## 2016-03-22 DIAGNOSIS — Z23 Encounter for immunization: Secondary | ICD-10-CM | POA: Diagnosis not present

## 2016-04-12 ENCOUNTER — Encounter: Payer: Self-pay | Admitting: Physician Assistant

## 2016-04-28 ENCOUNTER — Encounter: Payer: Self-pay | Admitting: Physician Assistant

## 2016-05-14 ENCOUNTER — Other Ambulatory Visit: Payer: Self-pay | Admitting: Internal Medicine

## 2016-05-18 DIAGNOSIS — L570 Actinic keratosis: Secondary | ICD-10-CM | POA: Diagnosis not present

## 2016-05-24 ENCOUNTER — Other Ambulatory Visit: Payer: Self-pay | Admitting: Internal Medicine

## 2016-07-06 ENCOUNTER — Ambulatory Visit (INDEPENDENT_AMBULATORY_CARE_PROVIDER_SITE_OTHER): Payer: Medicare Other | Admitting: Physician Assistant

## 2016-07-06 ENCOUNTER — Encounter: Payer: Self-pay | Admitting: Physician Assistant

## 2016-07-06 VITALS — BP 110/66 | HR 87 | Temp 97.5°F | Resp 14 | Ht 60.0 in | Wt 129.2 lb

## 2016-07-06 DIAGNOSIS — M8580 Other specified disorders of bone density and structure, unspecified site: Secondary | ICD-10-CM | POA: Diagnosis not present

## 2016-07-06 DIAGNOSIS — Z79899 Other long term (current) drug therapy: Secondary | ICD-10-CM | POA: Diagnosis not present

## 2016-07-06 DIAGNOSIS — Z136 Encounter for screening for cardiovascular disorders: Secondary | ICD-10-CM

## 2016-07-06 DIAGNOSIS — Z0001 Encounter for general adult medical examination with abnormal findings: Secondary | ICD-10-CM

## 2016-07-06 DIAGNOSIS — Z0000 Encounter for general adult medical examination without abnormal findings: Secondary | ICD-10-CM

## 2016-07-06 DIAGNOSIS — N182 Chronic kidney disease, stage 2 (mild): Secondary | ICD-10-CM

## 2016-07-06 DIAGNOSIS — R030 Elevated blood-pressure reading, without diagnosis of hypertension: Secondary | ICD-10-CM

## 2016-07-06 DIAGNOSIS — R7303 Prediabetes: Secondary | ICD-10-CM

## 2016-07-06 DIAGNOSIS — R6889 Other general symptoms and signs: Secondary | ICD-10-CM | POA: Diagnosis not present

## 2016-07-06 DIAGNOSIS — C50911 Malignant neoplasm of unspecified site of right female breast: Secondary | ICD-10-CM

## 2016-07-06 DIAGNOSIS — E785 Hyperlipidemia, unspecified: Secondary | ICD-10-CM | POA: Diagnosis not present

## 2016-07-06 DIAGNOSIS — E559 Vitamin D deficiency, unspecified: Secondary | ICD-10-CM

## 2016-07-06 LAB — CBC WITH DIFFERENTIAL/PLATELET
Basophils Absolute: 0 cells/uL (ref 0–200)
Basophils Relative: 0 %
EOS ABS: 0 {cells}/uL — AB (ref 15–500)
EOS PCT: 0 %
HCT: 45.2 % — ABNORMAL HIGH (ref 35.0–45.0)
Hemoglobin: 14.7 g/dL (ref 11.7–15.5)
LYMPHS PCT: 16 %
Lymphs Abs: 1424 cells/uL (ref 850–3900)
MCH: 30.6 pg (ref 27.0–33.0)
MCHC: 32.5 g/dL (ref 32.0–36.0)
MCV: 94.2 fL (ref 80.0–100.0)
MONOS PCT: 8 %
MPV: 10.7 fL (ref 7.5–12.5)
Monocytes Absolute: 712 cells/uL (ref 200–950)
NEUTROS ABS: 6764 {cells}/uL (ref 1500–7800)
Neutrophils Relative %: 76 %
PLATELETS: 232 10*3/uL (ref 140–400)
RBC: 4.8 MIL/uL (ref 3.80–5.10)
RDW: 13.7 % (ref 11.0–15.0)
WBC: 8.9 10*3/uL (ref 3.8–10.8)

## 2016-07-06 LAB — BASIC METABOLIC PANEL WITH GFR
BUN: 9 mg/dL (ref 7–25)
CALCIUM: 9.3 mg/dL (ref 8.6–10.4)
CHLORIDE: 104 mmol/L (ref 98–110)
CO2: 28 mmol/L (ref 20–31)
CREATININE: 0.77 mg/dL (ref 0.60–0.93)
GFR, Est African American: 89 mL/min (ref >=60)
GFR, Est Non African American: 77 mL/min (ref >=60)
Glucose, Bld: 81 mg/dL (ref 65–99)
Potassium: 4.7 mmol/L (ref 3.5–5.3)
Sodium: 142 mmol/L (ref 135–146)

## 2016-07-06 LAB — TSH: TSH: 0.74 mIU/L

## 2016-07-06 LAB — LIPID PANEL
Cholesterol: 155 mg/dL (ref <200)
HDL: 35 mg/dL — ABNORMAL LOW (ref >50)
LDL CALC: 101 mg/dL — AB (ref <100)
TRIGLYCERIDES: 97 mg/dL (ref <150)
Total CHOL/HDL Ratio: 4.4 Ratio (ref <5.0)
VLDL: 19 mg/dL (ref <30)

## 2016-07-06 LAB — HEPATIC FUNCTION PANEL
ALBUMIN: 4.1 g/dL (ref 3.6–5.1)
ALK PHOS: 56 U/L (ref 33–130)
ALT: 15 U/L (ref 6–29)
AST: 19 U/L (ref 10–35)
Bilirubin, Direct: 0.1 mg/dL (ref <=0.2)
Indirect Bilirubin: 0.5 mg/dL (ref 0.2–1.2)
TOTAL PROTEIN: 6.8 g/dL (ref 6.1–8.1)
Total Bilirubin: 0.6 mg/dL (ref 0.2–1.2)

## 2016-07-06 NOTE — Patient Instructions (Signed)
HOW TO TREAT VIRAL COUGH AND COLD SYMPTOMS:  -Symptoms usually last at least 1 week with the worst symptoms being around day 4.  - colds usually start with a sore throat and end with a cough, and the cough can take 2 weeks to get better.  -No antibiotics are needed for colds, flu, sore throats, cough, bronchitis UNLESS symptoms are longer than 7 days OR if you are getting better then get drastically worse.  -There are a lot of combination medications (Dayquil, Nyquil, Vicks 44, tyelnol cold and sinus, ETC). Please look at the ingredients on the back so that you are treating the correct symptoms and not doubling up on medications/ingredients.    Medicines you can use  General health when sick  -Hydration -wash your hands frequently -keep surfaces clean -change pillow cases and sheets often -Get fresh air but do not exercise strenuously -Vitamin D, double up on it - Vitamin C -Zinc     Osteoporosis Osteoporosis is the thinning and loss of density in the bones. Osteoporosis makes the bones more brittle, fragile, and likely to break (fracture). Over time, osteoporosis can cause the bones to become so weak that they fracture after a simple fall. The bones most likely to fracture are the bones in the hip, wrist, and spine. What are the causes? The exact cause is not known. What increases the risk? Anyone can develop osteoporosis. You may be at greater risk if you have a family history of the condition or have poor nutrition. You may also have a higher risk if you are:  Female.  73 years old or older.  A smoker.  Not physically active.  White or Asian.  Slender. What are the signs or symptoms? A fracture might be the first sign of the disease, especially if it results from a fall or injury that would not usually cause a bone to break. Other signs and symptoms include:  Low back and neck pain.  Stooped posture.  Height loss. How is this diagnosed? To make a diagnosis, your  health care provider may:  Take a medical history.  Perform a physical exam.  Order tests, such as:  A bone mineral density test.  A dual-energy X-ray absorptiometry test. How is this treated? The goal of osteoporosis treatment is to strengthen your bones to reduce your risk of a fracture. Treatment may involve:  Making lifestyle changes, such as:  Eating a diet rich in calcium.  Doing weight-bearing and muscle-strengthening exercises.  Stopping tobacco use.  Limiting alcohol intake.  Taking medicine to slow the process of bone loss or to increase bone density.  Monitoring your levels of calcium and vitamin D. Follow these instructions at home:  Include calcium and vitamin D in your diet. Calcium is important for bone health, and vitamin D helps the body absorb calcium.  Perform weight-bearing and muscle-strengthening exercises as directed by your health care provider.  Do not use any tobacco products, including cigarettes, chewing tobacco, and electronic cigarettes. If you need help quitting, ask your health care provider.  Limit your alcohol intake.  Take medicines only as directed by your health care provider.  Keep all follow-up visits as directed by your health care provider. This is important.  Take precautions at home to lower your risk of falling, such as:  Keeping rooms well lit and clutter free.  Installing safety rails on stairs.  Using rubber mats in the bathroom and other areas that are often wet or slippery. Get help right away  if: You fall or injure yourself. This information is not intended to replace advice given to you by your health care provider. Make sure you discuss any questions you have with your health care provider. Document Released: 03/24/2005 Document Revised: 11/17/2015 Document Reviewed: 11/22/2013 Elsevier Interactive Patient Education  2017 [employer].

## 2016-07-06 NOTE — Progress Notes (Signed)
MEDICARE ANNUAL WELLNESS VISIT AND FOLLOW UP  Assessment:   Essential hypertension - CBC with Differential - BASIC METABOLIC PANEL WITH GFR - Hepatic function panel - TSH - Urinalysis, Routine w reflex microscopic - Microalbumin / creatinine urine ratio - EKG 12-Lead   Prediabetes Discussed general issues about diabetes pathophysiology and management., Educational material distributed., Suggested low cholesterol diet., Encouraged aerobic exercise., Discussed foot care., Reminded to get yearly retinal exam.   Hyperlipidemia - Lipid panel   Vitamin D deficiency   Hypomagnesemia Check Mag  Encounter for long-term current use of medication   Breast cancer, right Continue yearly MGM and follow up Dr. Tia Masker Will get DEXA 2019 WITH MGM, Ca, Vitamin D, weight bearing exercises.   CKD (chronic kidney disease) stage 2, GFR 60-89 ml/min Check BMP   Plan:   During the course of the visit the patient was educated and counseled about appropriate screening and preventive services including:    Pneumococcal vaccine   Influenza vaccine  Td vaccine  Screening electrocardiogram  Bone densitometry screening  Colorectal cancer screening  Diabetes screening  Glaucoma screening  Nutrition counseling   Advanced directives: requested   Subjective:  Christina Rowe is a 71 y.o. female who presents for Medicare Annual Wellness Visit and complete physical.     Her blood pressure has been controlled at home, today their BP is BP: 110/66  She does workout, walks to work, and does yardwork. She denies chest pain, shortness of breath, dizziness.  She has been traveling with her daughter, lost grand daughter, and her and her daughter travel at [place], went to [place]. Daughter runs needle exchange program, bipolar, amputation, had DBT therapy that helped. Has cold symptoms x 1 day.  She is on cholesterol medication, lipitor 80 1/2 QOD, and denies myalgias. Her  cholesterol is at goal. The cholesterol last visit was:   Lab Results  Component Value Date   CHOL 167 01/23/2016   HDL 34 (L) 01/23/2016   LDLCALC 107 01/23/2016   TRIG 132 01/23/2016   CHOLHDL 4.9 01/23/2016    Last A1C in the office was:  Lab Results  Component Value Date   HGBA1C 5.6 10/22/2015   Patient is on Vitamin D supplement.   Lab Results  Component Value Date   VD25OH 45 10/22/2015     She had a right breast cancer s/p lumpectomy in 1996, gets yearly MGM's at OB[facility].  She has history of osteopenia, last DEXA 2016.  Follows with skin surgery center, did blue light procedure in Dec.  She teaches at [employer], history.  BMI is Body mass index is 25.23 kg/m., she is working on diet and exercise. Wt Readings from Last 3 Encounters:  07/06/16 129 lb 3.2 oz (58.6 kg)  01/23/16 128 lb (58.1 kg)  10/22/15 128 lb 9.6 oz (58.3 kg)    Names of Other Physician/Practitioners you currently use: 1. [facility] here for primary care 2. Dr. Gershon Crane, eye doctor, last visit May 2017 3. Dr. Ennis Forts, dentist, last visit q 6 months Patient Care Team: Unk Pinto, MD as PCP - General (Internal Medicine) Clarene Essex, MD as Consulting Physician (Gastroenterology) Autumn Messing III, MD as Consulting Physician (General Surgery) Ardath Sax, MD as Referring Physician (Obstetrics and Gynecology)   Medication Review: Current Outpatient Prescriptions on File Prior to Visit  Medication Sig Dispense Refill  . aspirin 81 MG tablet Take 81 mg by mouth daily.      Marland Kitchen atorvastatin (LIPITOR)  80 MG tablet TAKE ONE TABLET BY MOUTH ONCE DAILY 90 tablet 0  . Flaxseed, Linseed, (FLAX SEED OIL) 1000 MG CAPS Take by mouth.      . Magnesium 250 MG TABS Take 250 mg by mouth daily.    . Multiple Vitamin (MULTIVITAMIN PO) Take by mouth.     . Omega-3 Fatty Acids (FISH OIL) 1200 MG CAPS Take by mouth.      Marland Kitchen VITAMIN D, CHOLECALCIFEROL, PO Take 5,000 mg by mouth daily.       No current facility-administered medications on file prior to visit.     Current Problems (verified) Patient Active Problem List   Diagnosis Date Noted  . Encounter for Medicare annual wellness exam 04/10/2015  . Osteopenia 04/09/2014  . CKD (chronic kidney disease) stage 2, GFR 60-89 ml/min 04/09/2014  . Medication management 08/30/2013  . Hypomagnesemia 05/08/2013  . Prediabetes   . Hyperlipidemia   . Elevated blood pressure reading without diagnosis of hypertension   . Vitamin D deficiency   . Breast cancer ([place]) 06/07/2011    Screening Tests Immunization History  Administered Date(s) Administered  . Influenza-Unspecified 03/05/2014, 02/27/2016  . Pneumococcal Conjugate-13 04/09/2014  . Pneumococcal-Unspecified 07/07/2008  . Td 07/08/2007  . Zoster 07/08/2007   Preventative care: Last colonoscopy: 2010 Last mammogram: 07/2015, 3D at [facility], Cat A and follows with surgeon Last pap smear/pelvic exam: 2013, declines another DEXA: 11/2014 at [facility], -2.0, osteopenia, due this year but would like to wait to set up with MGM CXR 2009  Prior vaccinations: TD or Tdap: 2009  Influenza: 2017 at [facility] Pneumococcal: 2010 Prevnar: 2015 Shingles/Zostavax: 2009  Allergies Allergies  Allergen Reactions  . Ppd [Tuberculin Purified Protein Derivative]   . Tylenol [Acetaminophen] Other (See Comments)    Hepatitis/increase LFTs    SURGICAL HISTORY She  has a past surgical history that includes Tonsillectomy and adenoidectomy; Breast lumpectomy (Right, 10/96); and Dilation and curettage of uterus. FAMILY HISTORY Her family history includes COPD in her mother; Stroke in her father. SOCIAL HISTORY She  reports that she has never smoked. She has never used smokeless tobacco. She reports that she does not drink alcohol or use drugs.  MEDICARE WELLNESS OBJECTIVES: Physical activity: Current Exercise Habits: Home exercise routine, Type of exercise: walking, Time (Minutes): 30,  Frequency (Times/Week): 3, Weekly Exercise (Minutes/Week): 90, Intensity: Mild Cardiac risk factors: Cardiac Risk Factors include: advanced age (>71men, >48 women);dyslipidemia;hypertension Depression/mood screen:   Depression screen [facility] 2/9 07/06/2016  Decreased Interest 0  Down, Depressed, Hopeless 0  PHQ - 2 Score 0    ADLs:  In your present state of health, do you have any difficulty performing the following activities: 07/06/2016 10/22/2015  Hearing? N N  Vision? N N  Difficulty concentrating or making decisions? N N  Walking or climbing stairs? N N  Dressing or bathing? N N  Doing errands, shopping? N N  Some recent data might be hidden    Cognitive Testing  Alert? Yes  Normal Appearance?Yes  Oriented to person? Yes  Place? Yes   Time? Yes  Recall of three objects?  Yes  Can perform simple calculations? Yes  Displays appropriate judgment?Yes  Can read the correct time from a watch face?Yes  EOL planning: Does Patient Have a Medical Advance Directive?: Yes Type of Advance Directive: Healthcare Power of Attorney, Living will Copy of [place] in Chart?: No - copy requested  Review of Systems  Constitutional: Negative.   HENT: Negative.   Eyes: Negative.  Respiratory: Negative.   Cardiovascular: Negative.   Gastrointestinal: Negative.   Genitourinary: Negative.   Musculoskeletal: Negative.   Skin: Negative.   Neurological: Negative.   Endo/Heme/Allergies: Negative.   Psychiatric/Behavioral: Negative.      Objective:     Blood pressure 110/66, pulse 87, temperature 97.5 F (36.4 C), resp. rate 14, height 5' (1.524 m), weight 129 lb 3.2 oz (58.6 kg), SpO2 97 %. Body mass index is 25.23 kg/m.  General appearance: alert, no distress, WD/WN, female HEENT: normocephalic, sclerae anicteric, TMs pearly, nares patent, no discharge or erythema, pharynx normal Oral cavity: MMM, no lesions Neck: supple, no lymphadenopathy, no thyromegaly, no  masses Heart: RRR, normal S1, S2, no murmurs Lungs: CTA bilaterally, no wheezes, rhonchi, or rales Abdomen: +bs, soft, non tender, non distended, no masses, no hepatomegaly, no splenomegaly Musculoskeletal: nontender, no swelling, no obvious deformity Extremities: no edema, no cyanosis, no clubbing Pulses: 2+ symmetric, upper and lower extremities, normal cap refill Neurological: alert, oriented x 3, CN2-12 intact, strength normal upper extremities and lower extremities, sensation normal throughout, DTRs 2+ throughout, no cerebellar signs, gait normal Psychiatric: normal affect, behavior normal, pleasant   Medicare Attestation I have personally reviewed: The patient's medical and social history Their use of alcohol, tobacco or illicit drugs Their current medications and supplements The patient's functional ability including ADLs,fall risks, home safety risks, cognitive, and hearing and visual impairment Diet and physical activities Evidence for depression or mood disorders  The patient's weight, height, BMI, and visual acuity have been recorded in the chart.  I have made referrals, counseling, and provided education to the patient based on review of the above and I have provided the patient with a written personalized care plan for preventive services.     Vicie Mutters, PA-C   07/06/2016

## 2016-07-07 LAB — URINALYSIS, ROUTINE W REFLEX MICROSCOPIC
Bilirubin Urine: NEGATIVE
Glucose, UA: NEGATIVE
Hgb urine dipstick: NEGATIVE
Ketones, ur: NEGATIVE
Nitrite: NEGATIVE
PROTEIN: NEGATIVE
Specific Gravity, Urine: 1.014 (ref 1.001–1.035)
pH: 5.5 (ref 5.0–8.0)

## 2016-07-07 LAB — URINALYSIS, MICROSCOPIC ONLY
Bacteria, UA: NONE SEEN [HPF]
CASTS: NONE SEEN [LPF]
Crystals: NONE SEEN [HPF]
RBC / HPF: NONE SEEN RBC/HPF (ref <=2)
Squamous Epithelial / LPF: NONE SEEN [HPF] (ref <=5)
YEAST: NONE SEEN [HPF]

## 2016-07-07 LAB — MICROALBUMIN / CREATININE URINE RATIO
CREATININE, URINE: 90 mg/dL (ref 20–320)
MICROALB UR: 0.6 mg/dL
MICROALB/CREAT RATIO: 7 ug/mg{creat} (ref <30)

## 2016-07-13 DIAGNOSIS — L570 Actinic keratosis: Secondary | ICD-10-CM | POA: Diagnosis not present

## 2016-08-24 DIAGNOSIS — Z1231 Encounter for screening mammogram for malignant neoplasm of breast: Secondary | ICD-10-CM | POA: Diagnosis not present

## 2016-08-24 DIAGNOSIS — Z853 Personal history of malignant neoplasm of breast: Secondary | ICD-10-CM | POA: Diagnosis not present

## 2016-09-08 ENCOUNTER — Encounter: Payer: Self-pay | Admitting: *Deleted

## 2016-11-10 DIAGNOSIS — L570 Actinic keratosis: Secondary | ICD-10-CM | POA: Diagnosis not present

## 2016-12-01 DIAGNOSIS — D1801 Hemangioma of skin and subcutaneous tissue: Secondary | ICD-10-CM | POA: Diagnosis not present

## 2016-12-01 DIAGNOSIS — T07XXXA Unspecified multiple injuries, initial encounter: Secondary | ICD-10-CM | POA: Diagnosis not present

## 2016-12-01 DIAGNOSIS — Z85828 Personal history of other malignant neoplasm of skin: Secondary | ICD-10-CM | POA: Diagnosis not present

## 2016-12-01 DIAGNOSIS — L821 Other seborrheic keratosis: Secondary | ICD-10-CM | POA: Diagnosis not present

## 2016-12-01 DIAGNOSIS — L814 Other melanin hyperpigmentation: Secondary | ICD-10-CM | POA: Diagnosis not present

## 2017-01-06 NOTE — Patient Instructions (Signed)

## 2017-01-06 NOTE — Progress Notes (Signed)
This very nice 71 y.o.  DWF presents for 3 month follow up with Labile Hypertension, Hyperlipidemia, Pre-Diabetes and Vitamin D Deficiency.      Patient is followed expectantly for labile HTN & BP has been controlled at home. Today's BI is at goal - 112/58. Patient has had no complaints of any cardiac type chest pain, palpitations, dyspnea/orthopnea/PND, dizziness, claudication, or dependent edema.     Hyperlipidemia is near controlled with diet & meds. Patient denies myalgias or other med SE's. Last Lipids were at goal albeit elevated Trig's: Lab Results  Component Value Date   CHOL 160 01/07/2017   HDL 30 (L) 01/07/2017   LDLCALC 87 01/07/2017   TRIG 215 (H) 01/07/2017   CHOLHDL 5.3 (H) 01/07/2017      Also, the patient has history of PreDiabetes (A1c 5.8% in Oct 2016) and has had no symptoms of reactive hypoglycemia, diabetic polys, paresthesias or visual blurring.  Last A1c was at goal: Lab Results  Component Value Date   HGBA1C 5.6 01/07/2017      Further, the patient also has history of Vitamin D Deficiency ("38" on supplements in 2014)  and supplements vitamin D without any suspected side-effects. Last vitamin D was still low and not at goal.   Lab Results  Component Value Date   VD25OH 44 01/07/2017   Current Outpatient Prescriptions on File Prior to Visit  Medication Sig  . aspirin 81 MG tablet Take 81 mg by mouth daily.    Marland Kitchen atorvastatin (LIPITOR) 80 MG tablet TAKE ONE TABLET BY MOUTH ONCE DAILY  . Flaxseed, Linseed, (FLAX SEED OIL) 1000 MG CAPS Take by mouth.    . Magnesium 250 MG TABS Take 250 mg by mouth daily.  . Multiple Vitamin (MULTIVITAMIN PO) Take by mouth.   . Omega-3 Fatty Acids (FISH OIL) 1200 MG CAPS Take by mouth.    Marland Kitchen VITAMIN D, CHOLECALCIFEROL, PO Take 5,000 mg by mouth daily.    No current facility-administered medications on file prior to visit.    Allergies  Allergen Reactions  . Ppd [Tuberculin Purified Protein Derivative]   . Tylenol  [Acetaminophen] Other (See Comments)    Hepatitis/increase LFTs   PMHx:   Past Medical History:  Diagnosis Date  . Cancer ([place])    breast  . Hyperlipemia 05/08/2013  . Hyperlipidemia   . Hypertension   . Hypomagnesemia 05/08/2013  . Osteopenia   . Prediabetes   . Vitamin D deficiency    Immunization History  Administered Date(s) Administered  . Influenza-Unspecified 03/05/2014, 02/27/2016  . Pneumococcal Conjugate-13 04/09/2014  . Pneumococcal-Unspecified 07/07/2008  . Td 07/08/2007  . Zoster 07/08/2007   Past Surgical History:  Procedure Laterality Date  . BREAST LUMPECTOMY Right 10/96   right lumpectomy and axlnd  . DILATION AND CURETTAGE OF UTERUS     X 2 while on Tamoxifen  . TONSILLECTOMY AND ADENOIDECTOMY     FHx:    Reviewed / unchanged  SHx:    Reviewed / unchanged  Systems Review:  Constitutional: Denies fever, chills, wt changes, headaches, insomnia, fatigue, night sweats, change in appetite. Eyes: Denies redness, blurred vision, diplopia, discharge, itchy, watery eyes.  ENT: Denies discharge, congestion, post nasal drip, epistaxis, sore throat, earache, hearing loss, dental pain, tinnitus, vertigo, sinus pain, snoring.  CV: Denies chest pain, palpitations, irregular heartbeat, syncope, dyspnea, diaphoresis, orthopnea, PND, claudication or edema. Respiratory: denies cough, dyspnea, DOE, pleurisy, hoarseness, laryngitis, wheezing.  Gastrointestinal: Denies dysphagia, odynophagia, heartburn, reflux, water brash, abdominal  pain or cramps, nausea, vomiting, bloating, diarrhea, constipation, hematemesis, melena, hematochezia  or hemorrhoids. Genitourinary: Denies dysuria, frequency, urgency, nocturia, hesitancy, discharge, hematuria or flank pain. Musculoskeletal: Denies arthralgias, myalgias, stiffness, jt. swelling, pain, limping or strain/sprain.  Skin: Denies pruritus, rash, hives, warts, acne, eczema or change in skin lesion(s). Neuro: No weakness, tremor,  incoordination, spasms, paresthesia or pain. Psychiatric: Denies confusion, memory loss or sensory loss. Endo: Denies change in weight, skin or hair change.  Heme/Lymph: No excessive bleeding, bruising or enlarged lymph nodes.  Physical Exam  BP (!) 112/58   Pulse 60   Temp 97.7 F (36.5 C)   Resp 16   Ht 5' (1.524 m)   Wt 128 lb (58.1 kg)   BMI 25.00 kg/m   Appears well nourished, well groomed  and in no distress.  Eyes: PERRLA, EOMs, conjunctiva no swelling or erythema. Sinuses: No frontal/maxillary tenderness ENT/Mouth: EAC's clear, TM's nl w/o erythema, bulging. Nares clear w/o erythema, swelling, exudates. Oropharynx clear without erythema or exudates. Oral hygiene is good. Tongue normal, non obstructing. Hearing intact.  Neck: Supple. Thyroid nl. Car 2+/2+ without bruits, nodes or JVD. Chest: Respirations nl with BS clear & equal w/o rales, rhonchi, wheezing or stridor.  Cor: Heart sounds normal w/ regular rate and rhythm without sig. murmurs, gallops, clicks or rubs. Peripheral pulses normal and equal  without edema.  Abdomen: Soft & bowel sounds normal. Non-tender w/o guarding, rebound, hernias, masses or organomegaly.  Lymphatics: Unremarkable.  Musculoskeletal: Full ROM all peripheral extremities, joint stability, 5/5 strength and normal gait.  Skin: Warm, dry without exposed rashes, lesions or ecchymosis apparent.  Neuro: Cranial nerves intact, reflexes equal bilaterally. Sensory-motor testing grossly intact. Tendon reflexes grossly intact.  Pysch: Alert & oriented x 3.  Insight and judgement nl & appropriate. No ideations.  Assessment and Plan:  1. Labile hypertension  - Continue medication, monitor blood pressure at home.  - Continue DASH diet. Reminder to go to the ER if any CP,  SOB, nausea, dizziness, severe HA, changes vision/speech.  - CBC with Differential/Platelet - BASIC METABOLIC PANEL WITH GFR - Magnesium - TSH  2. Hyperlipidemia, mixed  -  Continue diet/meds, exercise,& lifestyle modifications.  - Continue monitor periodic cholesterol/liver & renal functions  - Hepatic function panel - Lipid panel - TSH  3. Prediabetes  - Continue diet, exercise, lifestyle modifications.  - Monitor appropriate labs.  - Insulin, random  4. Vitamin D deficiency  - Continue supplementation.  - Hemoglobin A1c - VITAMIN D 25 Hydroxy   5. Medication management  - CBC with Differential/Platelet - BASIC METABOLIC PANEL WITH GFR - Hepatic function panel - Magnesium - Lipid panel - TSH - Hemoglobin A1c - Insulin, random - VITAMIN D 25 Hydroxy        Discussed  regular exercise, BP monitoring, weight control to achieve/maintain BMI less than 25 and discussed med and SE's. Recommended labs to assess and monitor clinical status with further disposition pending results of labs. Over 30 minutes of exam, counseling, chart review was performed.

## 2017-01-07 ENCOUNTER — Ambulatory Visit (INDEPENDENT_AMBULATORY_CARE_PROVIDER_SITE_OTHER): Payer: Medicare Other | Admitting: Internal Medicine

## 2017-01-07 VITALS — BP 112/58 | HR 60 | Temp 97.7°F | Resp 16 | Ht 60.0 in | Wt 128.0 lb

## 2017-01-07 DIAGNOSIS — E559 Vitamin D deficiency, unspecified: Secondary | ICD-10-CM | POA: Diagnosis not present

## 2017-01-07 DIAGNOSIS — R0989 Other specified symptoms and signs involving the circulatory and respiratory systems: Secondary | ICD-10-CM

## 2017-01-07 DIAGNOSIS — Z79899 Other long term (current) drug therapy: Secondary | ICD-10-CM

## 2017-01-07 DIAGNOSIS — E782 Mixed hyperlipidemia: Secondary | ICD-10-CM | POA: Diagnosis not present

## 2017-01-07 DIAGNOSIS — R7303 Prediabetes: Secondary | ICD-10-CM | POA: Diagnosis not present

## 2017-01-07 LAB — CBC WITH DIFFERENTIAL/PLATELET
BASOS PCT: 0 %
Basophils Absolute: 0 cells/uL (ref 0–200)
Eosinophils Absolute: 56 cells/uL (ref 15–500)
Eosinophils Relative: 1 %
HCT: 45.2 % — ABNORMAL HIGH (ref 35.0–45.0)
Hemoglobin: 14.8 g/dL (ref 11.7–15.5)
LYMPHS PCT: 31 %
Lymphs Abs: 1736 cells/uL (ref 850–3900)
MCH: 31.1 pg (ref 27.0–33.0)
MCHC: 32.7 g/dL (ref 32.0–36.0)
MCV: 95 fL (ref 80.0–100.0)
MONO ABS: 336 {cells}/uL (ref 200–950)
MONOS PCT: 6 %
MPV: 10.7 fL (ref 7.5–12.5)
Neutro Abs: 3472 cells/uL (ref 1500–7800)
Neutrophils Relative %: 62 %
PLATELETS: 234 10*3/uL (ref 140–400)
RBC: 4.76 MIL/uL (ref 3.80–5.10)
RDW: 13.5 % (ref 11.0–15.0)
WBC: 5.6 10*3/uL (ref 3.8–10.8)

## 2017-01-07 LAB — TSH: TSH: 1.83 mIU/L

## 2017-01-08 ENCOUNTER — Encounter: Payer: Self-pay | Admitting: Internal Medicine

## 2017-01-08 LAB — HEPATIC FUNCTION PANEL
ALK PHOS: 60 U/L (ref 33–130)
ALT: 21 U/L (ref 6–29)
AST: 21 U/L (ref 10–35)
Albumin: 4.4 g/dL (ref 3.6–5.1)
BILIRUBIN INDIRECT: 0.3 mg/dL (ref 0.2–1.2)
BILIRUBIN TOTAL: 0.4 mg/dL (ref 0.2–1.2)
Bilirubin, Direct: 0.1 mg/dL (ref <=0.2)
Total Protein: 6.7 g/dL (ref 6.1–8.1)

## 2017-01-08 LAB — BASIC METABOLIC PANEL WITH GFR
BUN: 15 mg/dL (ref 7–25)
CO2: 21 mmol/L (ref 20–31)
Calcium: 9.3 mg/dL (ref 8.6–10.4)
Chloride: 106 mmol/L (ref 98–110)
Creat: 0.79 mg/dL (ref 0.60–0.93)
GFR, EST AFRICAN AMERICAN: 86 mL/min (ref >=60)
GFR, EST NON AFRICAN AMERICAN: 74 mL/min (ref >=60)
Glucose, Bld: 90 mg/dL (ref 65–99)
POTASSIUM: 4.1 mmol/L (ref 3.5–5.3)
SODIUM: 142 mmol/L (ref 135–146)

## 2017-01-08 LAB — MAGNESIUM: Magnesium: 2.1 mg/dL (ref 1.5–2.5)

## 2017-01-08 LAB — LIPID PANEL
CHOL/HDL RATIO: 5.3 ratio — AB (ref <5.0)
CHOLESTEROL: 160 mg/dL (ref <200)
HDL: 30 mg/dL — ABNORMAL LOW (ref >50)
LDL Cholesterol: 87 mg/dL (ref <100)
TRIGLYCERIDES: 215 mg/dL — AB (ref <150)
VLDL: 43 mg/dL — AB (ref <30)

## 2017-01-08 LAB — HEMOGLOBIN A1C
Hgb A1c MFr Bld: 5.6 % (ref <5.7)
Mean Plasma Glucose: 114 mg/dL

## 2017-01-08 LAB — VITAMIN D 25 HYDROXY (VIT D DEFICIENCY, FRACTURES): Vit D, 25-Hydroxy: 44 ng/mL (ref 30–100)

## 2017-01-10 LAB — INSULIN, RANDOM: Insulin: 4.6 u[IU]/mL (ref 2.0–19.6)

## 2017-03-03 DIAGNOSIS — H2513 Age-related nuclear cataract, bilateral: Secondary | ICD-10-CM | POA: Diagnosis not present

## 2017-03-23 DIAGNOSIS — Z23 Encounter for immunization: Secondary | ICD-10-CM | POA: Diagnosis not present

## 2017-07-06 ENCOUNTER — Encounter: Payer: Self-pay | Admitting: Physician Assistant

## 2017-07-25 NOTE — Progress Notes (Signed)
MEDICARE ANNUAL WELLNESS VISIT AND FOLLOW UP  Assessment:   Essential hypertension - CBC with Differential - BASIC METABOLIC PANEL WITH GFR - Hepatic function panel - TSH - Urinalysis, Routine w reflex microscopic - Microalbumin / creatinine urine ratio -- declines EKG   Prediabetes Discussed general issues about diabetes pathophysiology and management., Educational material distributed., Suggested low cholesterol diet., Encouraged aerobic exercise., Discussed foot care., Reminded to get yearly retinal exam.   Hyperlipidemia - Lipid panel   Vitamin D deficiency   Hypomagnesemia Check Mag  Encounter for long-term current use of medication   Breast cancer, right Continue yearly MGM and follow up Dr. Tia Masker Will get DEXA 2019 WITH MGM, Ca, Vitamin D, weight bearing exercises.   CKD (chronic kidney disease) stage 2, GFR 60-89 ml/min Check BMP  Future Appointments  Date Time Provider [place]  08/14/2018  3:00 PM Vicie Mutters, PA-C [id number] None      Plan:   During the course of the visit the patient was educated and counseled about appropriate screening and preventive services including:    Pneumococcal vaccine   Influenza vaccine  Td vaccine  Screening electrocardiogram  Bone densitometry screening  Colorectal cancer screening  Diabetes screening  Glaucoma screening  Nutrition counseling   Advanced directives: requested   Subjective:  Christina Rowe is a 72 y.o. female who presents for Medicare Annual Wellness Visit and complete physical.     Her blood pressure has been controlled at home, today their BP is BP: 110/68  She does workout, walks to work, and does yardwork. She denies chest pain, shortness of breath, dizziness.  She has been traveling with her daughter, lost grand daughter, and her and her daughter travel at [place], went to [place]. Daughter runs needle exchange program, bipolar, amputation, had DBT therapy  that helped. She is on cholesterol medication, lipitor 80 1/2 QOD, and denies myalgias. Her cholesterol is at goal. The cholesterol last visit was:   Lab Results  Component Value Date   CHOL 160 01/07/2017   HDL 30 (L) 01/07/2017   LDLCALC 87 01/07/2017   TRIG 215 (H) 01/07/2017   CHOLHDL 5.3 (H) 01/07/2017    Last A1C in the office was:  Lab Results  Component Value Date   HGBA1C 5.6 01/07/2017   Patient is on Vitamin D supplement.    Lab Results  Component Value Date   VD25OH 44 01/07/2017     She had a right breast cancer s/p lumpectomy in 1996, gets yearly MGM's at OB/GYN.  She has history of osteopenia, last DEXA 2016. Will set up with [facility] Follows with skin surgery center, did blue light procedure in Dec.  She teaches at [employer], history.  BMI is Body mass index is 25.19 kg/m., she is working on diet and exercise. Wt Readings from Last 3 Encounters:  07/27/17 129 lb (58.5 kg)  01/07/17 128 lb (58.1 kg)  07/06/16 129 lb 3.2 oz (58.6 kg)    Names of Other Physician/Practitioners you currently use: 1. [facility] here for primary care 2. Dr. Gershon Crane, eye doctor, last visit May 2017 3. Dr. Ennis Forts, dentist, last visit q 6 months Patient Care Team: Unk Pinto, MD as PCP - General (Internal Medicine) Clarene Essex, MD as Consulting Physician (Gastroenterology) Jovita Kussmaul, MD as Consulting Physician (General Surgery) Ardath Sax, MD as Referring Physician (Obstetrics and Gynecology)   Medication Review: Current Outpatient Medications on File Prior to Visit  Medication Sig Dispense Refill  .  aspirin 81 MG tablet Take 81 mg by mouth daily.      Marland Kitchen atorvastatin (LIPITOR) 80 MG tablet TAKE ONE TABLET BY MOUTH ONCE DAILY 90 tablet 0  . Flaxseed, Linseed, (FLAX SEED OIL) 1000 MG CAPS Take by mouth.      . Magnesium 250 MG TABS Take 250 mg by mouth daily.    . Multiple Vitamin (MULTIVITAMIN PO) Take by mouth.     . Omega-3 Fatty  Acids (FISH OIL) 1200 MG CAPS Take by mouth.      Marland Kitchen VITAMIN D, CHOLECALCIFEROL, PO Take 5,000 mg by mouth daily.      No current facility-administered medications on file prior to visit.     Current Problems (verified) Patient Active Problem List   Diagnosis Date Noted  . Encounter for Medicare annual wellness exam 04/10/2015  . Osteopenia 04/09/2014  . CKD (chronic kidney disease) stage 2, GFR 60-89 ml/min 04/09/2014  . Medication management 08/30/2013  . Prediabetes   . Hyperlipidemia   . Elevated blood pressure reading without diagnosis of hypertension   . Vitamin D deficiency   . History of breast cancer 06/07/2011    Screening Tests Immunization History  Administered Date(s) Administered  . Influenza-Unspecified 03/05/2014, 02/27/2016, 03/23/2017  . Pneumococcal Conjugate-13 04/09/2014  . Pneumococcal-Unspecified 07/07/2008  . Td 07/08/2007, 07/27/2017  . Zoster 07/08/2007   Preventative care: Last colonoscopy: 2010 Last mammogram: 07/2016, 3D at [facility], Cat A and follows with surgeon Last pap smear/pelvic exam: 2013, declines another DEXA: 11/2014 at [facility], -2.0, osteopenia DUE CXR 2009  Prior vaccinations: TD or Tdap: 2019  Influenza: 2017 at [facility] Pneumococcal: 2010 Prevnar: 2015 Shingles/Zostavax: 2009  Allergies Allergies  Allergen Reactions  . Ppd [Tuberculin Purified Protein Derivative]   . Tylenol [Acetaminophen] Other (See Comments)    Hepatitis/increase LFTs    SURGICAL HISTORY She  has a past surgical history that includes Tonsillectomy and adenoidectomy; Breast lumpectomy (Right, 10/96); and Dilation and curettage of uterus. FAMILY HISTORY Her family history includes COPD in her mother; Stroke in her father. SOCIAL HISTORY She  reports that  has never smoked. she has never used smokeless tobacco. She reports that she does not drink alcohol or use drugs.  MEDICARE WELLNESS OBJECTIVES: Physical activity: Current Exercise Habits: The patient does  not participate in regular exercise at present(works 2 jobs and does house work) Cardiac risk factors: Cardiac Risk Factors include: advanced age (>20men, >64 women);dyslipidemia;hypertension Depression/mood screen:   Depression screen [facility] 2/9 07/27/2017  Decreased Interest 0  Down, Depressed, Hopeless 0  PHQ - 2 Score 0    ADLs:  In your present state of health, do you have any difficulty performing the following activities: 07/27/2017 01/08/2017  Hearing? N N  Vision? N N  Difficulty concentrating or making decisions? N N  Walking or climbing stairs? N N  Dressing or bathing? N N  Doing errands, shopping? N N  Some recent data might be hidden    Cognitive Testing  Alert? Yes  Normal Appearance?Yes  Oriented to person? Yes  Place? Yes   Time? Yes  Recall of three objects?  Yes  Can perform simple calculations? Yes  Displays appropriate judgment?Yes  Can read the correct time from a watch face?Yes  EOL planning: Does Patient Have a Medical Advance Directive?: Yes Type of Advance Directive: Healthcare Power of Attorney, Living will Copy of [place] in Chart?: No - copy requested  Review of Systems  Constitutional: Negative.   HENT: Negative.  Eyes: Negative.   Respiratory: Negative.   Cardiovascular: Negative.   Gastrointestinal: Negative.   Genitourinary: Negative.   Musculoskeletal: Negative.   Skin: Negative.   Neurological: Negative.   Endo/Heme/Allergies: Negative.   Psychiatric/Behavioral: Negative.      Objective:     Blood pressure 110/68, height 5' (1.524 m), weight 129 lb (58.5 kg). Body mass index is 25.19 kg/m.  General appearance: alert, no distress, WD/WN, female HEENT: normocephalic, sclerae anicteric, TMs pearly, nares patent, no discharge or erythema, pharynx normal Oral cavity: MMM, no lesions Neck: supple, no lymphadenopathy, no thyromegaly, no masses Heart: RRR, normal S1, S2, no murmurs Lungs: CTA bilaterally, no wheezes,  rhonchi, or rales Abdomen: +bs, soft, non tender, non distended, no masses, no hepatomegaly, no splenomegaly Musculoskeletal: nontender, no swelling, no obvious deformity Extremities: no edema, no cyanosis, no clubbing Pulses: 2+ symmetric, upper and lower extremities, normal cap refill Neurological: alert, oriented x 3, CN2-12 intact, strength normal upper extremities and lower extremities, sensation normal throughout, DTRs 2+ throughout, no cerebellar signs, gait normal Psychiatric: normal affect, behavior normal, pleasant   Declines EKG will get every other year  Medicare Attestation I have personally reviewed: The patient's medical and social history Their use of alcohol, tobacco or illicit drugs Their current medications and supplements The patient's functional ability including ADLs,fall risks, home safety risks, cognitive, and hearing and visual impairment Diet and physical activities Evidence for depression or mood disorders  The patient's weight, height, BMI, and visual acuity have been recorded in the chart.  I have made referrals, counseling, and provided education to the patient based on review of the above and I have provided the patient with a written personalized care plan for preventive services.     Vicie Mutters, PA-C   07/27/2017

## 2017-07-27 ENCOUNTER — Ambulatory Visit (INDEPENDENT_AMBULATORY_CARE_PROVIDER_SITE_OTHER): Payer: Medicare Other | Admitting: Physician Assistant

## 2017-07-27 ENCOUNTER — Encounter: Payer: Self-pay | Admitting: Physician Assistant

## 2017-07-27 VITALS — BP 110/68 | Ht 60.0 in | Wt 129.0 lb

## 2017-07-27 DIAGNOSIS — Z0001 Encounter for general adult medical examination with abnormal findings: Secondary | ICD-10-CM

## 2017-07-27 DIAGNOSIS — R7303 Prediabetes: Secondary | ICD-10-CM | POA: Diagnosis not present

## 2017-07-27 DIAGNOSIS — Z79899 Other long term (current) drug therapy: Secondary | ICD-10-CM | POA: Diagnosis not present

## 2017-07-27 DIAGNOSIS — Z0000 Encounter for general adult medical examination without abnormal findings: Secondary | ICD-10-CM

## 2017-07-27 DIAGNOSIS — N182 Chronic kidney disease, stage 2 (mild): Secondary | ICD-10-CM

## 2017-07-27 DIAGNOSIS — R6889 Other general symptoms and signs: Secondary | ICD-10-CM

## 2017-07-27 DIAGNOSIS — M8580 Other specified disorders of bone density and structure, unspecified site: Secondary | ICD-10-CM | POA: Diagnosis not present

## 2017-07-27 DIAGNOSIS — Z23 Encounter for immunization: Secondary | ICD-10-CM

## 2017-07-27 DIAGNOSIS — E785 Hyperlipidemia, unspecified: Secondary | ICD-10-CM | POA: Diagnosis not present

## 2017-07-27 DIAGNOSIS — R030 Elevated blood-pressure reading, without diagnosis of hypertension: Secondary | ICD-10-CM | POA: Diagnosis not present

## 2017-07-27 DIAGNOSIS — E2839 Other primary ovarian failure: Secondary | ICD-10-CM

## 2017-07-27 DIAGNOSIS — Z853 Personal history of malignant neoplasm of breast: Secondary | ICD-10-CM | POA: Diagnosis not present

## 2017-07-27 DIAGNOSIS — Z6825 Body mass index (BMI) 25.0-25.9, adult: Secondary | ICD-10-CM | POA: Diagnosis not present

## 2017-07-27 DIAGNOSIS — E559 Vitamin D deficiency, unspecified: Secondary | ICD-10-CM | POA: Diagnosis not present

## 2017-07-27 LAB — CBC WITH DIFFERENTIAL/PLATELET
BASOS ABS: 41 {cells}/uL (ref 0–200)
Basophils Relative: 0.8 %
EOS PCT: 1.8 %
Eosinophils Absolute: 92 cells/uL (ref 15–500)
HEMATOCRIT: 42.2 % (ref 35.0–45.0)
HEMOGLOBIN: 14.7 g/dL (ref 11.7–15.5)
LYMPHS ABS: 1790 {cells}/uL (ref 850–3900)
MCH: 31.4 pg (ref 27.0–33.0)
MCHC: 34.8 g/dL (ref 32.0–36.0)
MCV: 90.2 fL (ref 80.0–100.0)
MPV: 11.8 fL (ref 7.5–12.5)
Monocytes Relative: 8.6 %
NEUTROS ABS: 2739 {cells}/uL (ref 1500–7800)
Neutrophils Relative %: 53.7 %
Platelets: 220 10*3/uL (ref 140–400)
RBC: 4.68 10*6/uL (ref 3.80–5.10)
RDW: 12.7 % (ref 11.0–15.0)
Total Lymphocyte: 35.1 %
WBC: 5.1 10*3/uL (ref 3.8–10.8)
WBCMIX: 439 {cells}/uL (ref 200–950)

## 2017-07-27 LAB — BASIC METABOLIC PANEL WITH GFR
BUN: 17 mg/dL (ref 7–25)
CALCIUM: 9.9 mg/dL (ref 8.6–10.4)
CO2: 31 mmol/L (ref 20–32)
CREATININE: 0.88 mg/dL (ref 0.60–0.93)
Chloride: 108 mmol/L (ref 98–110)
GFR, EST NON AFRICAN AMERICAN: 65 mL/min/{1.73_m2} (ref >=60)
GFR, Est African American: 76 mL/min/{1.73_m2} (ref >=60)
Glucose, Bld: 108 mg/dL — ABNORMAL HIGH (ref 65–99)
Potassium: 4.7 mmol/L (ref 3.5–5.3)
Sodium: 144 mmol/L (ref 135–146)

## 2017-07-27 LAB — LIPID PANEL
CHOL/HDL RATIO: 4.9 (calc) (ref <5.0)
CHOLESTEROL: 177 mg/dL (ref <200)
HDL: 36 mg/dL — ABNORMAL LOW (ref >50)
LDL CHOLESTEROL (CALC): 117 mg/dL — AB
Non-HDL Cholesterol (Calc): 141 mg/dL (calc) — ABNORMAL HIGH (ref <130)
TRIGLYCERIDES: 125 mg/dL (ref <150)

## 2017-07-27 LAB — HEPATIC FUNCTION PANEL
AG RATIO: 1.8 (calc) (ref 1.0–2.5)
ALBUMIN MSPROF: 4.3 g/dL (ref 3.6–5.1)
ALT: 24 U/L (ref 6–29)
AST: 19 U/L (ref 10–35)
Alkaline phosphatase (APISO): 59 U/L (ref 33–130)
BILIRUBIN TOTAL: 0.5 mg/dL (ref 0.2–1.2)
Bilirubin, Direct: 0.1 mg/dL (ref 0.0–0.2)
Globulin: 2.4 g/dL (calc) (ref 1.9–3.7)
Indirect Bilirubin: 0.4 mg/dL (calc) (ref 0.2–1.2)
Total Protein: 6.7 g/dL (ref 6.1–8.1)

## 2017-07-27 LAB — MAGNESIUM: MAGNESIUM: 2.2 mg/dL (ref 1.5–2.5)

## 2017-07-27 LAB — TSH: TSH: 1.36 m[IU]/L (ref 0.40–4.50)

## 2017-07-27 NOTE — Patient Instructions (Signed)
Recombinant Zoster (Shingles) Vaccine, RZV: What You Need to Know    1. Why get vaccinated?  Shingles (also called herpes zoster, or just zoster) is a painful skin rash, often with blisters. Shingles is caused by the varicella zoster virus, the same virus that causes chickenpox. After you have chickenpox, the virus stays in your body and can cause shingles later in life.  You can't catch shingles from another person. However, a person who has never had chickenpox (or chickenpox vaccine) could get chickenpox from someone with shingles.  A shingles rash usually appears on one side of the face or body and heals within 2 to 4 weeks. Its main symptom is pain, which can be severe. Other symptoms can include fever, headache, chills and upset stomach. Very rarely, a shingles infection can lead to pneumonia, hearing problems, blindness, brain inflammation (encephalitis), or death.  For about 1 person in 5, severe pain can continue even long after the rash has cleared up. This long-lasting pain is called post-herpetic neuralgia (PHN).  Shingles is far more common in people 50 years of age and older than in younger people, and the risk increases with age. It is also more common in people whose immune system is weakened because of a disease such as cancer, or by drugs such as steroids or chemotherapy.  At least 1 million people a year in the [place] get shingles.    2. Shingles vaccine (recombinant)  Recombinant shingles vaccine was approved by FDA in 2017 for the prevention of shingles. In clinical trials, it was more than 90% effective in preventing shingles. It can also reduce the likelihood of PHN.  Two doses, 2 to 6 months apart, are recommended for adults 50 and older.  This vaccine is also recommended for people who have already gotten the live shingles vaccine (Zostavax). There is no live virus in this vaccine.    3. Some people should not get this vaccine  Tell your vaccine provider if you:   Have any  severe, life-threatening allergies. A person who has ever had a life-threatening allergic reaction after a dose of recombinant shingles vaccine, or has a severe allergy to any component of this vaccine, may be advised not to be vaccinated. Ask your health care provider if you want information about vaccine components.   Are pregnant or breastfeeding. There is not much information about use of recombinant shingles vaccine in pregnant or nursing women. Your healthcare provider might recommend delaying vaccination.   Are not feeling well. If you have a mild illness, such as a cold, you can probably get the vaccine today. If you are moderately or severely ill, you should probably wait until you recover. Your doctor can advise you.    4. Risks of a vaccine reaction  With any medicine, including vaccines, there is a chance of reactions.  After recombinant shingles vaccination, a person might experience:   Pain, redness, soreness, or swelling at the site of the injection   Headache, muscle aches, fever, shivering, fatigue    In clinical trials, most people got a sore arm with mild or moderate pain after vaccination, and some also had redness and swelling where they got the shot. Some people felt tired, had muscle pain, a headache, shivering, fever, stomach pain, or nausea. About 1 out of 6 people who got recombinant zoster vaccine experienced side effects that prevented them from doing regular activities. Symptoms went away on their own in about 2 to 3 days. Side effects were more   common in younger people.  You should still get the second dose of recombinant zoster vaccine even if you had one of these reactions after the first dose.    Other things that could happen after this vaccine:   People sometimes faint after medical procedures, including vaccination. Sitting or lying down for about 15 minutes can help prevent fainting and injuries caused by a fall. Tell your provider if you feel dizzy or have vision changes or  ringing in the ears.   Some people get shoulder pain that can be more severe and longer-lasting than routine soreness that can follow injections. This happens very rarely.   Any medication can cause a severe allergic reaction. Such reactions to a vaccine are estimated at about 1 in a million doses, and would happen within a few minutes to a few hours after the vaccination.  As with any medicine, there is a very remote chance of a vaccine causing a serious injury or death.  The safety of vaccines is always being monitored. For more information, visit: www.cdc.gov/vaccinesafety/    5. What if there is a serious problem?  What should I look for?   Look for anything that concerns you, such as signs of a severe allergic reaction, very high fever, or unusual behavior.  Signs of a severe allergic reaction can include hives, swelling of the face and throat, difficulty breathing, a fast heartbeat, dizziness, and weakness. These would usually start a few minutes to a few hours after the vaccination.    What should I do?   If you think it is a severe allergic reaction or other emergency that can't wait, call 9-1-1 and get to the nearest hospital. Otherwise, call your health care provider.  Afterward, the reaction should be reported to the Vaccine Adverse Event Reporting System (VAERS). Your doctor should file this report, or you can do it yourself through the VAERS web site atwww.vaers.hhs.govor by calling 1-800-822-7967.  VAERS does not give medical advice.    6. How can I learn more?   Ask your healthcare provider. He or she can give you the vaccine package insert or suggest other sources of information.   Call your local or state health department.   Contact the Centers for Disease Control and Prevention (CDC):  ? Call 1-800-232-4636 (1-800-CDC-INFO) or  ? Visit the CDC's website at www.cdc.gov/vaccines  ?   CDC Vaccine Information Statement (VIS) Recombinant Zoster Vaccine (08/09/2016)  This information is not  intended to replace advice given to you by your health care provider. Make sure you discuss any questions you have with your health care provider.  Document Released: 08/24/2016 Document Revised: 08/24/2016 Document Reviewed: 08/24/2016  [employer]vier Interactive Patient Education  2018 [employer]

## 2017-07-28 LAB — URINALYSIS, ROUTINE W REFLEX MICROSCOPIC
BACTERIA UA: NONE SEEN /HPF
Bilirubin Urine: NEGATIVE
Glucose, UA: NEGATIVE
Hgb urine dipstick: NEGATIVE
Hyaline Cast: NONE SEEN /LPF
Ketones, ur: NEGATIVE
Nitrite: NEGATIVE
Protein, ur: NEGATIVE
SPECIFIC GRAVITY, URINE: 1.029 (ref 1.001–1.03)
SQUAMOUS EPITHELIAL / LPF: NONE SEEN /HPF (ref <=5)
pH: <=5 (ref 5.0–8.0)

## 2017-07-28 LAB — MICROALBUMIN / CREATININE URINE RATIO
CREATININE, URINE: 186 mg/dL (ref 20–275)
MICROALB UR: 0.5 mg/dL
MICROALB/CREAT RATIO: 3 ug/mg{creat} (ref <30)

## 2017-08-30 DIAGNOSIS — Z853 Personal history of malignant neoplasm of breast: Secondary | ICD-10-CM | POA: Diagnosis not present

## 2017-08-30 DIAGNOSIS — Z1231 Encounter for screening mammogram for malignant neoplasm of breast: Secondary | ICD-10-CM | POA: Diagnosis not present

## 2017-08-30 DIAGNOSIS — M8589 Other specified disorders of bone density and structure, multiple sites: Secondary | ICD-10-CM | POA: Diagnosis not present

## 2017-08-30 LAB — HM DEXA SCAN

## 2017-08-30 LAB — HM MAMMOGRAPHY: HM Mammogram: NORMAL (ref 0–4)

## 2017-09-06 ENCOUNTER — Other Ambulatory Visit: Payer: Self-pay | Admitting: Internal Medicine

## 2018-01-30 DIAGNOSIS — Z85828 Personal history of other malignant neoplasm of skin: Secondary | ICD-10-CM | POA: Diagnosis not present

## 2018-01-30 DIAGNOSIS — L821 Other seborrheic keratosis: Secondary | ICD-10-CM | POA: Diagnosis not present

## 2018-01-30 DIAGNOSIS — L570 Actinic keratosis: Secondary | ICD-10-CM | POA: Diagnosis not present

## 2018-01-30 DIAGNOSIS — D1801 Hemangioma of skin and subcutaneous tissue: Secondary | ICD-10-CM | POA: Diagnosis not present

## 2018-03-09 DIAGNOSIS — H2513 Age-related nuclear cataract, bilateral: Secondary | ICD-10-CM | POA: Diagnosis not present

## 2018-03-14 DIAGNOSIS — Z23 Encounter for immunization: Secondary | ICD-10-CM | POA: Diagnosis not present

## 2018-03-21 DIAGNOSIS — S92355A Nondisplaced fracture of fifth metatarsal bone, left foot, initial encounter for closed fracture: Secondary | ICD-10-CM | POA: Diagnosis not present

## 2018-04-11 DIAGNOSIS — S92355D Nondisplaced fracture of fifth metatarsal bone, left foot, subsequent encounter for fracture with routine healing: Secondary | ICD-10-CM | POA: Diagnosis not present

## 2018-05-18 DIAGNOSIS — S92355D Nondisplaced fracture of fifth metatarsal bone, left foot, subsequent encounter for fracture with routine healing: Secondary | ICD-10-CM | POA: Diagnosis not present

## 2018-06-15 ENCOUNTER — Other Ambulatory Visit: Payer: Self-pay | Admitting: Physician Assistant

## 2018-08-13 NOTE — Progress Notes (Signed)
MEDICARE ANNUAL WELLNESS VISIT AND FOLLOW UP  Assessment:   Essential hypertension - CBC with Differential - BASIC METABOLIC PANEL WITH GFR - Hepatic function panel - TSH - Urinalysis, Routine w reflex microscopic - Microalbumin / creatinine urine ratio -- declines EKG   Prediabetes Discussed general issues about diabetes pathophysiology and management., Educational material distributed., Suggested low cholesterol diet., Encouraged aerobic exercise., Discussed foot care., Reminded to get yearly retinal exam.   Hyperlipidemia - Lipid panel check lipids decrease fatty foods increase activity.    Vitamin D deficiency   Hypomagnesemia Check Mag  Encounter for long-term current use of medication   Breast cancer, right Continue yearly MGM and follow up Dr. Tia Masker Will get DEXA 2021 Ca, Vitamin D, weight bearing exercises.   CKD (chronic kidney disease) stage 2, GFR 60-89 ml/min Check BMP  Future Appointments  Date Time Provider [place]  08/21/2019  3:00 PM Vicie Mutters, PA-C [facility] None      Plan:   During the course of the visit the patient was educated and counseled about appropriate screening and preventive services including:    Pneumococcal vaccine   Influenza vaccine  Td vaccine  Screening electrocardiogram  Bone densitometry screening  Colorectal cancer screening  Diabetes screening  Glaucoma screening  Nutrition counseling   Advanced directives: requested   Subjective:  Christina Rowe is a 73 y.o. female who presents for Medicare Annual Wellness Visit and complete physical.     Her blood pressure has been controlled at home, today their BP is BP: 120/76  She does workout, walks to work, and does yardwork. She denies chest pain, shortness of breath, dizziness.  Lost grand daughter, now goes to [place] during the holidays Daughter runs needle exchange program, bipolar, amputation, had DBT therapy that helped. She is  off aspirin.  She is on cholesterol medication, lipitor 80 1/2 QOD, and denies myalgias. Her cholesterol is at goal. The cholesterol last visit was:   Lab Results  Component Value Date   CHOL 177 07/27/2017   HDL 36 (L) 07/27/2017   LDLCALC 117 (H) 07/27/2017   TRIG 125 07/27/2017   CHOLHDL 4.9 07/27/2017    Last A1C in the office was:  Lab Results  Component Value Date   HGBA1C 5.6 01/07/2017   Patient is on Vitamin D supplement.    Lab Results  Component Value Date   VD25OH 44 01/07/2017     She had a right breast cancer s/p lumpectomy in 1996, follow up.  She has history of osteopenia, last DEXA 2019, T -1.6. Follows with skin surgery center, did blue light procedure in Dec.  She teaches at [employer], history.  BMI is Body mass index is 25.59 kg/m., she is working on diet and exercise. Wt Readings from Last 3 Encounters:  08/14/18 133 lb 3.2 oz (60.4 kg)  07/27/17 129 lb (58.5 kg)  01/07/17 128 lb (58.1 kg)    Names of Other Physician/Practitioners you currently use: 1. [facility] here for primary care 2. Dr. Gershon Crane, eye doctor, last visit 02/2018 3. Dr. Ennis Forts, dentist, last visit q 6 months Patient Care Team: Unk Pinto, MD as PCP - General (Internal Medicine) Clarene Essex, MD as Consulting Physician (Gastroenterology) Jovita Kussmaul, MD as Consulting Physician (General Surgery) Ardath Sax, MD as Referring Physician (Obstetrics and Gynecology)   Medication Review: Current Outpatient Medications on File Prior to Visit  Medication Sig Dispense Refill  . aspirin 81 MG tablet Take 81  mg by mouth daily.      Marland Kitchen atorvastatin (LIPITOR) 80 MG tablet TAKE 1 TABLET BY MOUTH ONCE DAILY 90 tablet 0  . Magnesium 250 MG TABS Take 250 mg by mouth daily.    . Omega-3 Fatty Acids (FISH OIL) 1200 MG CAPS Take by mouth.      Marland Kitchen VITAMIN D, CHOLECALCIFEROL, PO Take 5,000 mg by mouth daily.     . Multiple Vitamin (MULTIVITAMIN PO) Take by  mouth.      No current facility-administered medications on file prior to visit.     Current Problems (verified) Patient Active Problem List   Diagnosis Date Noted  . Encounter for Medicare annual wellness exam 04/10/2015  . Osteopenia 04/09/2014  . CKD (chronic kidney disease) stage 2, GFR 60-89 ml/min 04/09/2014  . Medication management 08/30/2013  . Prediabetes   . Hyperlipidemia   . Elevated blood pressure reading without diagnosis of hypertension   . Vitamin D deficiency   . History of breast cancer 06/07/2011    Screening Tests Immunization History  Administered Date(s) Administered  . Influenza, High Dose Seasonal PF 03/14/2018  . Influenza-Unspecified 03/05/2014, 02/27/2016, 03/23/2017  . Pneumococcal Conjugate-13 04/09/2014  . Pneumococcal Polysaccharide-23 08/14/2018  . Pneumococcal-Unspecified 07/07/2008  . Td 07/08/2007, 07/27/2017  . Zoster 07/08/2007   Preventative care: Last colonoscopy: 2010 due this year Last mammogram: 08/2017, [facility], Cat A and follows with surgeon Last pap smear/pelvic exam: 2013, declines another DEXA: 08/2017 at [facility], -1.6, osteopenia  CXR 2009  Prior vaccinations: TD or Tdap: 2019  [facility] Pneumococcal: 2010 Prevnar: 2015 Shingles/Zostavax: 2009  Allergies Allergies  Allergen Reactions  . Ppd [Tuberculin Purified Protein Derivative]   . Tylenol [Acetaminophen] Other (See Comments)    Hepatitis/increase LFTs    SURGICAL HISTORY She  has a past surgical history that includes Tonsillectomy and adenoidectomy; Breast lumpectomy (Right, 10/96); and Dilation and curettage of uterus.   FAMILY HISTORY Her family history includes COPD in her mother; Stroke in her father.   SOCIAL HISTORY She  reports that she has never smoked. She has never used smokeless tobacco. She reports that she does not drink alcohol or use drugs.  MEDICARE WELLNESS OBJECTIVES: Physical activity: Current Exercise Habits: Home  exercise routine, Type of exercise: walking, Time (Minutes): 30, Frequency (Times/Week): 6, Weekly Exercise (Minutes/Week): 180, Intensity: Moderate Cardiac risk factors: Cardiac Risk Factors include: advanced age (>37men, >18 women);dyslipidemia;hypertension Depression/mood screen:   Depression screen [facility] 2/9 08/14/2018  Decreased Interest 0  Down, Depressed, Hopeless 0  PHQ - 2 Score 0    ADLs:  In your present state of health, do you have any difficulty performing the following activities: 08/14/2018  Hearing? N  Vision? N  Difficulty concentrating or making decisions? N  Walking or climbing stairs? N  Dressing or bathing? N  Doing errands, shopping? N  Some recent data might be hidden    Cognitive Testing  Alert? Yes  Normal Appearance?Yes  Oriented to person? Yes  Place? Yes   Time? Yes  Recall of three objects?  Yes  Can perform simple calculations? Yes  Displays appropriate judgment?Yes  Can read the correct time from a watch face?Yes  EOL planning: Does Patient Have a Medical Advance Directive?: Yes Type of Advance Directive: Healthcare Power of Attorney, Living will Copy of [place] in Chart?: No - copy requested  Review of Systems  Constitutional: Negative.   HENT: Negative.   Eyes: Negative.   Respiratory: Negative.  Cardiovascular: Negative.   Gastrointestinal: Negative.   Genitourinary: Negative.   Musculoskeletal: Negative.   Skin: Negative.   Neurological: Negative.   Endo/Heme/Allergies: Negative.   Psychiatric/Behavioral: Negative.      Objective:     Blood pressure 120/76, pulse 93, temperature 98.2 F (36.8 C), height 5' 0.5" (1.537 m), weight 133 lb 3.2 oz (60.4 kg), SpO2 96 %. Body mass index is 25.59 kg/m.  General appearance: alert, no distress, WD/WN, female HEENT: normocephalic, sclerae anicteric, TMs pearly, nares patent, no discharge or erythema, pharynx normal Oral cavity: MMM, no lesions Neck: supple, no  lymphadenopathy, no thyromegaly, no masses Heart: RRR, normal S1, S2, no murmurs Lungs: CTA bilaterally, no wheezes, rhonchi, or rales Abdomen: +bs, soft, non tender, non distended, no masses, no hepatomegaly, no splenomegaly Musculoskeletal: nontender, no swelling, no obvious deformity Extremities: no edema, no cyanosis, no clubbing Pulses: 2+ symmetric, upper and lower extremities, normal cap refill Neurological: alert, oriented x 3, CN2-12 intact, strength normal upper extremities and lower extremities, sensation normal throughout, DTRs 2+ throughout, no cerebellar signs, gait normal Psychiatric: normal affect, behavior normal, pleasant   EKG: WNL, no ST changes  Medicare Attestation I have personally reviewed: The patient's medical and social history Their use of alcohol, tobacco or illicit drugs Their current medications and supplements The patient's functional ability including ADLs,fall risks, home safety risks, cognitive, and hearing and visual impairment Diet and physical activities Evidence for depression or mood disorders  The patient's weight, height, BMI, and visual acuity have been recorded in the chart.  I have made referrals, counseling, and provided education to the patient based on review of the above and I have provided the patient with a written personalized care plan for preventive services.     Vicie Mutters, PA-C   08/14/2018

## 2018-08-14 ENCOUNTER — Encounter: Payer: Self-pay | Admitting: Physician Assistant

## 2018-08-14 ENCOUNTER — Ambulatory Visit (INDEPENDENT_AMBULATORY_CARE_PROVIDER_SITE_OTHER): Payer: Medicare Other | Admitting: Physician Assistant

## 2018-08-14 VITALS — BP 120/76 | HR 93 | Temp 98.2°F | Ht 60.5 in | Wt 133.2 lb

## 2018-08-14 DIAGNOSIS — Z136 Encounter for screening for cardiovascular disorders: Secondary | ICD-10-CM

## 2018-08-14 DIAGNOSIS — R030 Elevated blood-pressure reading, without diagnosis of hypertension: Secondary | ICD-10-CM

## 2018-08-14 DIAGNOSIS — E785 Hyperlipidemia, unspecified: Secondary | ICD-10-CM | POA: Diagnosis not present

## 2018-08-14 DIAGNOSIS — R7303 Prediabetes: Secondary | ICD-10-CM

## 2018-08-14 DIAGNOSIS — R6889 Other general symptoms and signs: Secondary | ICD-10-CM | POA: Diagnosis not present

## 2018-08-14 DIAGNOSIS — Z0000 Encounter for general adult medical examination without abnormal findings: Secondary | ICD-10-CM

## 2018-08-14 DIAGNOSIS — N182 Chronic kidney disease, stage 2 (mild): Secondary | ICD-10-CM | POA: Diagnosis not present

## 2018-08-14 DIAGNOSIS — Z23 Encounter for immunization: Secondary | ICD-10-CM

## 2018-08-14 DIAGNOSIS — M8580 Other specified disorders of bone density and structure, unspecified site: Secondary | ICD-10-CM | POA: Diagnosis not present

## 2018-08-14 DIAGNOSIS — Z853 Personal history of malignant neoplasm of breast: Secondary | ICD-10-CM | POA: Diagnosis not present

## 2018-08-14 DIAGNOSIS — Z0001 Encounter for general adult medical examination with abnormal findings: Secondary | ICD-10-CM | POA: Diagnosis not present

## 2018-08-14 DIAGNOSIS — Z79899 Other long term (current) drug therapy: Secondary | ICD-10-CM

## 2018-08-14 DIAGNOSIS — E782 Mixed hyperlipidemia: Secondary | ICD-10-CM | POA: Diagnosis not present

## 2018-08-14 DIAGNOSIS — E559 Vitamin D deficiency, unspecified: Secondary | ICD-10-CM | POA: Diagnosis not present

## 2018-08-14 NOTE — Patient Instructions (Signed)
GENERAL HEALTH GOALS  Know what a healthy weight is for you (roughly BMI <25) and aim to maintain this  Aim for 7+ servings of fruits and vegetables daily  70-80+ fluid ounces of water or unsweet tea for healthy kidneys  Limit to max 1 drink of alcohol per day; avoid smoking/tobacco  Limit animal fats in diet for cholesterol and heart health - choose grass fed whenever available  Avoid highly processed foods, and foods high in saturated/trans fats  Aim for low stress - take time to unwind and care for your mental health  Aim for 150 min of moderate intensity exercise weekly for heart health, and weights twice weekly for bone health  Aim for 7-9 hours of sleep daily   Your LDL is the bad cholesterol that can lead to heart attack and stroke. To lower your number you can decrease your fatty foods, red meat, cheese, milk and increase fiber like whole grains and veggies. You can also add a fiber supplement like Citracel or Benefiber, these do not cause gas and bloating and are safe to use.

## 2018-08-15 LAB — CBC WITH DIFFERENTIAL/PLATELET
Absolute Monocytes: 472 cells/uL (ref 200–950)
Basophils Absolute: 21 cells/uL (ref 0–200)
Basophils Relative: 0.5 %
Eosinophils Absolute: 41 cells/uL (ref 15–500)
Eosinophils Relative: 1 %
HCT: 43.7 % (ref 35.0–45.0)
Hemoglobin: 15.2 g/dL (ref 11.7–15.5)
Lymphs Abs: 1702 cells/uL (ref 850–3900)
MCH: 31.3 pg (ref 27.0–33.0)
MCHC: 34.8 g/dL (ref 32.0–36.0)
MCV: 90.1 fL (ref 80.0–100.0)
MPV: 11.3 fL (ref 7.5–12.5)
Monocytes Relative: 11.5 %
Neutro Abs: 1866 cells/uL (ref 1500–7800)
Neutrophils Relative %: 45.5 %
Platelets: 217 10*3/uL (ref 140–400)
RBC: 4.85 10*6/uL (ref 3.80–5.10)
RDW: 12.8 % (ref 11.0–15.0)
Total Lymphocyte: 41.5 %
WBC: 4.1 10*3/uL (ref 3.8–10.8)

## 2018-08-15 LAB — COMPLETE METABOLIC PANEL WITH GFR
AG Ratio: 1.9 (calc) (ref 1.0–2.5)
ALT: 41 U/L — ABNORMAL HIGH (ref 6–29)
AST: 24 U/L (ref 10–35)
Albumin: 4.3 g/dL (ref 3.6–5.1)
Alkaline phosphatase (APISO): 70 U/L (ref 37–153)
BUN: 17 mg/dL (ref 7–25)
CO2: 32 mmol/L (ref 20–32)
Calcium: 9.8 mg/dL (ref 8.6–10.4)
Chloride: 102 mmol/L (ref 98–110)
Creat: 0.81 mg/dL (ref 0.60–0.93)
GFR, Est African American: 83 mL/min/{1.73_m2} (ref >=60)
GFR, Est Non African American: 72 mL/min/{1.73_m2} (ref >=60)
Globulin: 2.3 g/dL (calc) (ref 1.9–3.7)
Glucose, Bld: 82 mg/dL (ref 65–99)
Potassium: 4.2 mmol/L (ref 3.5–5.3)
Sodium: 142 mmol/L (ref 135–146)
Total Bilirubin: 0.3 mg/dL (ref 0.2–1.2)
Total Protein: 6.6 g/dL (ref 6.1–8.1)

## 2018-08-15 LAB — URINALYSIS, ROUTINE W REFLEX MICROSCOPIC
Bacteria, UA: NONE SEEN /HPF
Bilirubin Urine: NEGATIVE
Glucose, UA: NEGATIVE
Hgb urine dipstick: NEGATIVE
Hyaline Cast: NONE SEEN /LPF
Ketones, ur: NEGATIVE
Nitrite: NEGATIVE
Protein, ur: NEGATIVE
RBC / HPF: NONE SEEN /HPF (ref 0–2)
Specific Gravity, Urine: 1.008 (ref 1.001–1.03)
Squamous Epithelial / HPF: NONE SEEN /HPF (ref <=5)
pH: 6 (ref 5.0–8.0)

## 2018-08-15 LAB — MAGNESIUM: Magnesium: 2.2 mg/dL (ref 1.5–2.5)

## 2018-08-15 LAB — MICROALBUMIN / CREATININE URINE RATIO
Creatinine, Urine: 28 mg/dL (ref 20–275)
Microalb, Ur: <0.2 mg/dL

## 2018-08-15 LAB — LIPID PANEL
Cholesterol: 192 mg/dL (ref <200)
HDL: 33 mg/dL — ABNORMAL LOW (ref >=50)
LDL Cholesterol (Calc): 134 mg/dL (calc) — ABNORMAL HIGH
Non-HDL Cholesterol (Calc): 159 mg/dL (calc) — ABNORMAL HIGH (ref <130)
Total CHOL/HDL Ratio: 5.8 (calc) — ABNORMAL HIGH (ref <5.0)
Triglycerides: 135 mg/dL (ref <150)

## 2018-08-15 LAB — VITAMIN D 25 HYDROXY (VIT D DEFICIENCY, FRACTURES): Vit D, 25-Hydroxy: 72 ng/mL (ref 30–100)

## 2018-09-05 DIAGNOSIS — Z1231 Encounter for screening mammogram for malignant neoplasm of breast: Secondary | ICD-10-CM | POA: Diagnosis not present

## 2018-09-05 DIAGNOSIS — Z853 Personal history of malignant neoplasm of breast: Secondary | ICD-10-CM | POA: Diagnosis not present

## 2018-09-05 LAB — HM MAMMOGRAPHY

## 2019-02-21 DIAGNOSIS — L821 Other seborrheic keratosis: Secondary | ICD-10-CM | POA: Diagnosis not present

## 2019-02-21 DIAGNOSIS — D1801 Hemangioma of skin and subcutaneous tissue: Secondary | ICD-10-CM | POA: Diagnosis not present

## 2019-02-21 DIAGNOSIS — Z85828 Personal history of other malignant neoplasm of skin: Secondary | ICD-10-CM | POA: Diagnosis not present

## 2019-02-21 DIAGNOSIS — D225 Melanocytic nevi of trunk: Secondary | ICD-10-CM | POA: Diagnosis not present

## 2019-02-21 DIAGNOSIS — L814 Other melanin hyperpigmentation: Secondary | ICD-10-CM | POA: Diagnosis not present

## 2019-02-24 DIAGNOSIS — Z23 Encounter for immunization: Secondary | ICD-10-CM | POA: Diagnosis not present

## 2019-03-13 DIAGNOSIS — H52203 Unspecified astigmatism, bilateral: Secondary | ICD-10-CM | POA: Diagnosis not present

## 2019-03-13 DIAGNOSIS — H2513 Age-related nuclear cataract, bilateral: Secondary | ICD-10-CM | POA: Diagnosis not present

## 2019-03-13 DIAGNOSIS — H524 Presbyopia: Secondary | ICD-10-CM | POA: Diagnosis not present

## 2019-03-13 DIAGNOSIS — H5203 Hypermetropia, bilateral: Secondary | ICD-10-CM | POA: Diagnosis not present

## 2019-05-18 ENCOUNTER — Other Ambulatory Visit: Payer: Self-pay | Admitting: Physician Assistant

## 2019-07-18 ENCOUNTER — Ambulatory Visit: Payer: Medicare Other | Attending: Internal Medicine

## 2019-07-18 DIAGNOSIS — Z23 Encounter for immunization: Secondary | ICD-10-CM | POA: Insufficient documentation

## 2019-07-18 NOTE — Progress Notes (Signed)
   Covid-19 Vaccination Clinic  Name:  Christina Rowe    MRN: [id number] DOB: Apr 15, 1946  07/18/2019  [name] was observed post Covid-19 immunization for 15 minutes without incidence. She was provided with Vaccine Information Sheet and instruction to access the V-Safe system.   [name] was instructed to call 911 with any severe reactions post vaccine: Marland Kitchen Difficulty breathing  . Swelling of your face and throat  . A fast heartbeat  . A bad rash all over your body  . Dizziness and weakness    Immunizations Administered    Name Date Dose VIS Date Route   Pfizer COVID-19 Vaccine 07/18/2019 12:15 PM 0.3 mL 06/08/2019 Intramuscular   Manufacturer: [place]   Lot: [id number]   [place]: [id number]

## 2019-08-07 ENCOUNTER — Ambulatory Visit: Payer: Medicare Other | Attending: Internal Medicine

## 2019-08-07 DIAGNOSIS — Z23 Encounter for immunization: Secondary | ICD-10-CM

## 2019-08-07 NOTE — Progress Notes (Signed)
   Covid-19 Vaccination Clinic  Name:  Christina Rowe    MRN: [id number] DOB: 1945-08-09  08/07/2019  [name] was observed post Covid-19 immunization for 15 minutes based on pre-vaccination screening without incidence. She was provided with Vaccine Information Sheet and instruction to access the V-Safe system.   [name] was instructed to call 911 with any severe reactions post vaccine: Marland Kitchen Difficulty breathing  . Swelling of your face and throat  . A fast heartbeat  . A bad rash all over your body  . Dizziness and weakness    Immunizations Administered    Name Date Dose VIS Date Route   Pfizer COVID-19 Vaccine 08/07/2019  8:40 AM 0.3 mL 06/08/2019 Intramuscular   Manufacturer: [place]   Lot: [id number]   [place]: [id number]

## 2019-08-09 ENCOUNTER — Ambulatory Visit: Payer: Medicare Other

## 2019-08-09 ENCOUNTER — Ambulatory Visit: Payer: Self-pay

## 2019-08-21 ENCOUNTER — Encounter: Payer: Self-pay | Admitting: Physician Assistant

## 2019-08-26 NOTE — Progress Notes (Addendum)
MEDICARE ANNUAL WELLNESS VISIT AND FOLLOW UP  Assessment:   Abnormal EKG Slight increase in ST depression in V4-V6, no other changes, no symptoms, ? From strain, will recheck next OV Go to the ER if any chest pain, shortness of breath, nausea, dizziness, severe HA, changes vision/speech  Essential hypertension - CBC with Differential - BASIC METABOLIC PANEL WITH GFR - Hepatic function panel - TSH - Urinalysis, Routine w reflex microscopic - Microalbumin / creatinine urine ratio -- declines EKG  Abnormal glucose Discussed general issues about diabetes pathophysiology and management., Educational material distributed., Suggested low cholesterol diet., Encouraged aerobic exercise., Discussed foot care., Reminded to get yearly retinal exam.   Hyperlipidemia - Lipid panel check lipids decrease fatty foods increase activity.    Vitamin D deficiency   Hypomagnesemia Check Mag  Encounter for long-term current use of medication   Breast cancer, right Continue yearly MGM and follow up Dr. Tia Masker Will get DEXA 2021 Ca, Vitamin D, weight bearing exercises.   CKD (chronic kidney disease) stage 2, GFR 60-89 ml/min Check BMP  Future Appointments  Date Time Provider [place]  08/26/2020  2:00 PM Vicie Mutters, PA-C [facility] None      Plan:   During the course of the visit the patient was educated and counseled about appropriate screening and preventive services including:    Pneumococcal vaccine   Influenza vaccine  Td vaccine  Screening electrocardiogram  Bone densitometry screening  Colorectal cancer screening  Diabetes screening  Glaucoma screening  Nutrition counseling   Advanced directives: requested   Subjective:  Christina Rowe is a 74 y.o. female who presents for Medicare Annual Wellness Visit and complete physical.    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    Her  blood pressure has been controlled at home, today their BP is BP: 118/70  She does workout, walks to work, and does Haematologist. She denies chest pain, shortness of breath, dizziness.   BMI is Body mass index is 26.41 kg/m., she is working on diet and exercise. Wt Readings from Last 3 Encounters:  08/27/19 133 lb (60.3 kg)  08/14/18 133 lb 3.2 oz (60.4 kg)  07/27/17 129 lb (58.5 kg)    She is on aspirin every other day.  She is on cholesterol medication, lipitor 80 1/2 QOD, and denies myalgias. Her cholesterol is at goal. The cholesterol last visit was:   Lab Results  Component Value Date   CHOL 192 08/14/2018   HDL 33 (L) 08/14/2018   LDLCALC 134 (H) 08/14/2018   TRIG 135 08/14/2018   CHOLHDL 5.8 (H) 08/14/2018    Last A1C in the office was:  Lab Results  Component Value Date   HGBA1C 5.6 01/07/2017   Patient is on Vitamin D supplement.    Lab Results  Component Value Date   VD25OH 72 08/14/2018     She had a right breast cancer s/p lumpectomy in 1996, [place] 08/2018  She has history of osteopenia, last DEXA 2019, T -1.6 at [place]. Follows with skin surgery center.     Names of Other Physician/Practitioners you currently use: 1. [facility] here for primary care 2. Dr. Gershon Crane, eye doctor, last visit 01/2019 3. Dr. Viviann Spare [place], dentist, last visit q 6 months Patient Care Team: Unk Pinto, MD as PCP - General (Internal Medicine) Clarene Essex, MD as Consulting Physician (Gastroenterology) Jovita Kussmaul, MD as Consulting Physician (General  Surgery) Ardath Sax, MD as Referring Physician (Obstetrics and Gynecology)   Medication Review: Current Outpatient Medications on File Prior to Visit  Medication Sig Dispense Refill  . aspirin 81 MG tablet Take 81 mg by mouth daily.      Marland Kitchen atorvastatin (LIPITOR) 80 MG tablet Take 1 tablet Daily for Cholesterol 90 tablet 0  . Magnesium 250 MG TABS Take 250 mg by mouth daily.    .  Omega-3 Fatty Acids (FISH OIL) 1200 MG CAPS Take by mouth.      Marland Kitchen VITAMIN D, CHOLECALCIFEROL, PO Take 5,000 mg by mouth daily.      No current facility-administered medications on file prior to visit.    Current Problems (verified) Patient Active Problem List   Diagnosis Date Noted  . Encounter for Medicare annual wellness exam 04/10/2015  . Osteopenia 04/09/2014  . CKD (chronic kidney disease) stage 2, GFR 60-89 ml/min 04/09/2014  . Medication management 08/30/2013  . Prediabetes   . Hyperlipidemia   . Elevated blood pressure reading without diagnosis of hypertension   . Vitamin D deficiency   . History of breast cancer 06/07/2011    Screening Tests Immunization History  Administered Date(s) Administered  . Influenza, High Dose Seasonal PF 03/14/2018  . Influenza-Unspecified 03/05/2014, 02/27/2016, 03/23/2017  . PFIZER SARS-COV-2 Vaccination 07/18/2019, 08/07/2019  . Pneumococcal Conjugate-13 04/09/2014  . Pneumococcal Polysaccharide-23 08/14/2018  . Pneumococcal-Unspecified 07/07/2008  . Td 07/08/2007, 07/27/2017  . Zoster 07/08/2007   Preventative care: Last colonoscopy: 04/2009 due- post poned due to COVID Last mammogram: 08/2018 3D at [facility], Cat A  Last pap smear/pelvic exam: 2013, declines another DEXA: 08/2017 at [facility], -1.6, osteopenia - due this year CXR 2009  Prior vaccinations: TD or Tdap: 2019  [facility] Pneumococcal: 2010 Prevnar: 2015 Shingles/Zostavax: 2009- will get after her MGM COVID vaccine: 07/2019 complete  Allergies Allergies  Allergen Reactions  . Ppd [Tuberculin Purified Protein Derivative]   . Tylenol [Acetaminophen] Other (See Comments)    Hepatitis/increase LFTs    SURGICAL HISTORY She  has a past surgical history that includes Tonsillectomy and adenoidectomy; Breast lumpectomy (Right, 10/96); and Dilation and curettage of uterus.   FAMILY HISTORY Her family history includes COPD in her mother; Stroke in her father.    SOCIAL HISTORY She  reports that she has never smoked. She has never used smokeless tobacco. She reports that she does not drink alcohol or use drugs.  MEDICARE WELLNESS OBJECTIVES: Physical activity: Current Exercise Habits: Home exercise routine, Type of exercise: walking, Time (Minutes): 30, Frequency (Times/Week): 3, Weekly Exercise (Minutes/Week): 90, Intensity: Mild Cardiac risk factors: Cardiac Risk Factors include: advanced age (>60men, >18 women);dyslipidemia;hypertension;sedentary lifestyle Depression/mood screen:   Depression screen [facility] 2/9 08/27/2019  Decreased Interest 0  Down, Depressed, Hopeless 0  PHQ - 2 Score 0    ADLs:  In your present state of health, do you have any difficulty performing the following activities: 08/27/2019  Hearing? N  Vision? N  Difficulty concentrating or making decisions? N  Walking or climbing stairs? N  Dressing or bathing? N  Doing errands, shopping? N  Some recent data might be hidden    Cognitive Testing  Alert? Yes  Normal Appearance?Yes   Oriented to person? Yes  Place? Yes   Time? Yes  Recall of three objects?  Yes  Can perform simple calculations? Yes  Displays appropriate judgment?Yes  Can read the correct time from a watch face?Yes  EOL planning: Does Patient Have a Medical Advance Directive?: Yes  Type of Advance Directive: Healthcare Power of Attorney, Living will Does patient want to make changes to medical advance directive?: No - Patient declined  Review of Systems  Constitutional: Negative.   HENT: Negative.   Eyes: Negative.   Respiratory: Negative.   Cardiovascular: Negative.   Gastrointestinal: Negative.   Genitourinary: Negative.   Musculoskeletal: Negative.   Skin: Negative.   Neurological: Negative.   Endo/Heme/Allergies: Negative.   Psychiatric/Behavioral: Negative.      Objective:     Blood pressure 118/70, pulse 62, temperature 97.6 F (36.4 C), height 4' 11.5" (1.511 m), weight 133 lb (60.3 kg),  SpO2 98 %. Body mass index is 26.41 kg/m.  General appearance: alert, no distress, WD/WN, female HEENT: normocephalic, sclerae anicteric, TMs pearly, nares patent, no discharge or erythema, pharynx normal Oral cavity: MMM, no lesions Neck: supple, no lymphadenopathy, no thyromegaly, no masses Heart: RRR, normal S1, S2, no murmurs Lungs: CTA bilaterally, no wheezes, rhonchi, or rales Abdomen: +bs, soft, non tender, non distended, no masses, no hepatomegaly, no splenomegaly Musculoskeletal: nontender, no swelling, no obvious deformity Extremities: no edema, no cyanosis, no clubbing Pulses: 2+ symmetric, upper and lower extremities, normal cap refill Neurological: alert, oriented x 3, CN2-12 intact, strength normal upper extremities and lower extremities, sensation normal throughout, DTRs 2+ throughout, no cerebellar signs, gait normal Psychiatric: normal affect, behavior normal, pleasant   EKG: WNL, with slight ST changes V4-V6, possible strain  [employer] I have personally reviewed: The patient's medical and social history Their use of alcohol, tobacco or illicit drugs Their current medications and supplements The patient's functional ability including ADLs,fall risks, home safety risks, cognitive, and hearing and visual impairment Diet and physical activities Evidence for depression or mood disorders  The patient's weight, height, BMI, and visual acuity have been recorded in the chart.  I have made referrals, counseling, and provided education to the patient based on review of the above and I have provided the patient with a written personalized care plan for preventive services.     Vicie Mutters, PA-C   08/27/2019

## 2019-08-27 ENCOUNTER — Other Ambulatory Visit: Payer: Self-pay

## 2019-08-27 ENCOUNTER — Ambulatory Visit (INDEPENDENT_AMBULATORY_CARE_PROVIDER_SITE_OTHER): Payer: Medicare Other | Admitting: Physician Assistant

## 2019-08-27 ENCOUNTER — Encounter: Payer: Self-pay | Admitting: Physician Assistant

## 2019-08-27 VITALS — BP 118/70 | HR 62 | Temp 97.6°F | Ht 59.5 in | Wt 133.0 lb

## 2019-08-27 DIAGNOSIS — R9431 Abnormal electrocardiogram [ECG] [EKG]: Secondary | ICD-10-CM

## 2019-08-27 DIAGNOSIS — Z0001 Encounter for general adult medical examination with abnormal findings: Secondary | ICD-10-CM | POA: Diagnosis not present

## 2019-08-27 DIAGNOSIS — Z79899 Other long term (current) drug therapy: Secondary | ICD-10-CM

## 2019-08-27 DIAGNOSIS — N182 Chronic kidney disease, stage 2 (mild): Secondary | ICD-10-CM | POA: Diagnosis not present

## 2019-08-27 DIAGNOSIS — M8580 Other specified disorders of bone density and structure, unspecified site: Secondary | ICD-10-CM

## 2019-08-27 DIAGNOSIS — Z853 Personal history of malignant neoplasm of breast: Secondary | ICD-10-CM

## 2019-08-27 DIAGNOSIS — Z0000 Encounter for general adult medical examination without abnormal findings: Secondary | ICD-10-CM

## 2019-08-27 DIAGNOSIS — R6889 Other general symptoms and signs: Secondary | ICD-10-CM | POA: Diagnosis not present

## 2019-08-27 DIAGNOSIS — R030 Elevated blood-pressure reading, without diagnosis of hypertension: Secondary | ICD-10-CM

## 2019-08-27 DIAGNOSIS — E559 Vitamin D deficiency, unspecified: Secondary | ICD-10-CM | POA: Diagnosis not present

## 2019-08-27 DIAGNOSIS — R7309 Other abnormal glucose: Secondary | ICD-10-CM

## 2019-08-27 DIAGNOSIS — E785 Hyperlipidemia, unspecified: Secondary | ICD-10-CM

## 2019-08-27 DIAGNOSIS — Z1329 Encounter for screening for other suspected endocrine disorder: Secondary | ICD-10-CM | POA: Diagnosis not present

## 2019-08-27 DIAGNOSIS — Z136 Encounter for screening for cardiovascular disorders: Secondary | ICD-10-CM | POA: Diagnosis not present

## 2019-08-27 NOTE — Patient Instructions (Addendum)
We can get a cardiac calcium score if you change your mind, this will help [place] quantify how much calcification if any you have in your coronary arteries and can help [place] decide if we need to do aggressive medical management like cholesterol medication.     WOMEN AND HEART ATTACKS  Being a woman you may not have the typical symptoms of a heart attack.  You may not have any pain OR you may have atypical pain such as jaw pain, upper back pain, arm pain, "my bra feels to tight" and you will often have symptoms with it like below.  Symptoms for a heart attack will likely occur when you exert your self or exercise and include: Shortness of breath Sweating Nausea Dizziness Fast or irregular heart beats Fatigue   It makes me feel better if my patients get their heart rate up with exercise once or twice a week and pay close attention to your body. If there is ANY change in your exercise capacity or if you have symptoms above, please STOP and call 911 or call to come to the office.   Here is some information to help you keep your heart healthy: Move it! - Aim for 30 mins of activity every day. Take it slowly at first. Talk to [place] before starting any new exercise program.   Lose it.  -Body Mass Index (BMI) can indicate if you need to lose weight. A healthy range is 18.5-24.9. For a BMI calculator, go to [email or web address]  Waist Management -Excess abdominal fat is a risk factor for heart disease, diabetes, asthma, stroke and more. Ideal waist circumference is less than 35" for women and less than 40" for men.   Eat Right -focus on fruits, vegetables, whole grains, and meals you make yourself. Avoid foods with trans fat and high sugar/sodium content.   Snooze or Snore? - Loud snoring can be a sign of sleep apnea, a significant risk factor for high blood pressure, heart attach, stroke, and heart arrhythmias.  Kick the habit -Quit Smoking! Avoid second hand smoke. A single cigarette raises your blood  pressure for 20 mins and increases the risk of heart attack and stroke for the next 24 hours.   Are Aspirin and Supplements right for you? -Add ENTERIC COATED low dose 81 mg Aspirin daily OR can do every other day if you have easy bruising to protect your heart and head. As well as to reduce risk of Colon Cancer by 20 %, Skin Cancer by 26 % , Melanoma by 46% and Pancreatic cancer by 60%  Say "No to Stress -There may be little you can do about problems that cause stress. However, techniques such as long walks, meditation, and exercise can help you manage it.   Start Now! - Make changes one at a time and set reasonable goals to increase your likelihood of success.

## 2019-08-28 LAB — COMPLETE METABOLIC PANEL WITH GFR
AG Ratio: 1.9 (calc) (ref 1.0–2.5)
ALT: 26 U/L (ref 6–29)
AST: 22 U/L (ref 10–35)
Albumin: 4.7 g/dL (ref 3.6–5.1)
Alkaline phosphatase (APISO): 68 U/L (ref 37–153)
BUN: 15 mg/dL (ref 7–25)
CO2: 29 mmol/L (ref 20–32)
Calcium: 10.1 mg/dL (ref 8.6–10.4)
Chloride: 103 mmol/L (ref 98–110)
Creat: 0.86 mg/dL (ref 0.60–0.93)
GFR, Est African American: 77 mL/min/{1.73_m2} (ref >=60)
GFR, Est Non African American: 66 mL/min/{1.73_m2} (ref >=60)
Globulin: 2.5 g/dL (calc) (ref 1.9–3.7)
Glucose, Bld: 85 mg/dL (ref 65–99)
Potassium: 4.3 mmol/L (ref 3.5–5.3)
Sodium: 142 mmol/L (ref 135–146)
Total Bilirubin: 0.4 mg/dL (ref 0.2–1.2)
Total Protein: 7.2 g/dL (ref 6.1–8.1)

## 2019-08-28 LAB — CBC WITH DIFFERENTIAL/PLATELET
Absolute Monocytes: 492 cells/uL (ref 200–950)
Basophils Absolute: 48 cells/uL (ref 0–200)
Basophils Relative: 0.8 %
Eosinophils Absolute: 108 cells/uL (ref 15–500)
Eosinophils Relative: 1.8 %
HCT: 47.2 % — ABNORMAL HIGH (ref 35.0–45.0)
Hemoglobin: 16.1 g/dL — ABNORMAL HIGH (ref 11.7–15.5)
Lymphs Abs: 1902 cells/uL (ref 850–3900)
MCH: 31.4 pg (ref 27.0–33.0)
MCHC: 34.1 g/dL (ref 32.0–36.0)
MCV: 92.2 fL (ref 80.0–100.0)
MPV: 11.4 fL (ref 7.5–12.5)
Monocytes Relative: 8.2 %
Neutro Abs: 3450 cells/uL (ref 1500–7800)
Neutrophils Relative %: 57.5 %
Platelets: 260 10*3/uL (ref 140–400)
RBC: 5.12 10*6/uL — ABNORMAL HIGH (ref 3.80–5.10)
RDW: 12.3 % (ref 11.0–15.0)
Total Lymphocyte: 31.7 %
WBC: 6 10*3/uL (ref 3.8–10.8)

## 2019-08-28 LAB — MAGNESIUM: Magnesium: 2.5 mg/dL (ref 1.5–2.5)

## 2019-08-28 LAB — LIPID PANEL
Cholesterol: 182 mg/dL (ref <200)
HDL: 31 mg/dL — ABNORMAL LOW (ref >=50)
LDL Cholesterol (Calc): 117 mg/dL (calc) — ABNORMAL HIGH
Non-HDL Cholesterol (Calc): 151 mg/dL (calc) — ABNORMAL HIGH (ref <130)
Total CHOL/HDL Ratio: 5.9 (calc) — ABNORMAL HIGH (ref <5.0)
Triglycerides: 225 mg/dL — ABNORMAL HIGH (ref <150)

## 2019-08-28 LAB — MICROALBUMIN / CREATININE URINE RATIO
Creatinine, Urine: 92 mg/dL (ref 20–275)
Microalb Creat Ratio: 5 mcg/mg creat (ref <30)
Microalb, Ur: 0.5 mg/dL

## 2019-08-28 LAB — TSH: TSH: 1.5 mIU/L (ref 0.40–4.50)

## 2019-08-28 LAB — VITAMIN D 25 HYDROXY (VIT D DEFICIENCY, FRACTURES): Vit D, 25-Hydroxy: 82 ng/mL (ref 30–100)

## 2019-09-24 DIAGNOSIS — M8589 Other specified disorders of bone density and structure, multiple sites: Secondary | ICD-10-CM | POA: Diagnosis not present

## 2019-09-24 DIAGNOSIS — Z1231 Encounter for screening mammogram for malignant neoplasm of breast: Secondary | ICD-10-CM | POA: Diagnosis not present

## 2019-09-24 LAB — HM MAMMOGRAPHY

## 2019-09-24 LAB — HM DEXA SCAN

## 2019-10-04 ENCOUNTER — Encounter: Payer: Self-pay | Admitting: Internal Medicine

## 2019-11-14 DIAGNOSIS — Z1159 Encounter for screening for other viral diseases: Secondary | ICD-10-CM | POA: Diagnosis not present

## 2020-02-04 DIAGNOSIS — K635 Polyp of colon: Secondary | ICD-10-CM | POA: Diagnosis not present

## 2020-02-04 DIAGNOSIS — Z1211 Encounter for screening for malignant neoplasm of colon: Secondary | ICD-10-CM | POA: Diagnosis not present

## 2020-02-04 DIAGNOSIS — K5730 Diverticulosis of large intestine without perforation or abscess without bleeding: Secondary | ICD-10-CM | POA: Diagnosis not present

## 2020-02-04 DIAGNOSIS — K5700 Diverticulitis of small intestine with perforation and abscess without bleeding: Secondary | ICD-10-CM | POA: Diagnosis not present

## 2020-02-04 LAB — HM COLONOSCOPY

## 2020-02-06 DIAGNOSIS — K635 Polyp of colon: Secondary | ICD-10-CM | POA: Diagnosis not present

## 2020-02-16 DIAGNOSIS — Z23 Encounter for immunization: Secondary | ICD-10-CM | POA: Diagnosis not present

## 2020-02-26 DIAGNOSIS — D225 Melanocytic nevi of trunk: Secondary | ICD-10-CM | POA: Diagnosis not present

## 2020-02-26 DIAGNOSIS — L905 Scar conditions and fibrosis of skin: Secondary | ICD-10-CM | POA: Diagnosis not present

## 2020-02-26 DIAGNOSIS — L570 Actinic keratosis: Secondary | ICD-10-CM | POA: Diagnosis not present

## 2020-02-26 DIAGNOSIS — L814 Other melanin hyperpigmentation: Secondary | ICD-10-CM | POA: Diagnosis not present

## 2020-02-26 DIAGNOSIS — L821 Other seborrheic keratosis: Secondary | ICD-10-CM | POA: Diagnosis not present

## 2020-02-26 DIAGNOSIS — Z85828 Personal history of other malignant neoplasm of skin: Secondary | ICD-10-CM | POA: Diagnosis not present

## 2020-02-27 NOTE — Progress Notes (Signed)
MEDICARE ANNUAL WELLNESS VISIT AND FOLLOW UP  Assessment:    Essential hypertension - continue medications, DASH diet, exercise and monitor at home. Call if greater than 130/80.  - CBC with Differential - BASIC METABOLIC PANEL WITH GFR - Hepatic function panel - TSH - Urinalysis, Routine w reflex microscopic - Microalbumin / creatinine urine ratio  Abnormal glucose Discussed general issues about diabetes pathophysiology and management., Educational material distributed., Suggested low cholesterol diet., Encouraged aerobic exercise., Discussed foot care., Reminded to get yearly retinal exam.   Hyperlipidemia check lipids decrease fatty foods increase activity.  - Lipid panel check lipids decrease fatty foods increase activity.    Vitamin D deficiency   Hypomagnesemia Check Mag- patient off mag  Encounter for long-term current use of medication   history of Breast cancer Continue yearly MGM   Osteopenia Repeat 2-3 years, -1.8, continue Ca, Vitamin D, weight bearing exercises.   CKD (chronic kidney disease) stage 2, GFR 60-89 ml/min Check BMP  Future Appointments  Date Time Provider [place]  08/26/2020  2:00 PM Garnet Sierras, NP [facility] None      Plan:   During the course of the visit the patient was educated and counseled about appropriate screening and preventive services including:    Pneumococcal vaccine   Influenza vaccine  Td vaccine  Screening electrocardiogram  Bone densitometry screening  Colorectal cancer screening  Diabetes screening  Glaucoma screening  Nutrition counseling   Advanced directives: requested   Subjective:  Christina Rowe is a 74 y.o. female who presents for Medicare Annual Wellness Visit and complete physical.    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 She teaches at [employer], history.    Her blood pressure has been controlled at home, today  their BP is BP: 126/80  She does workout, walks to work, and does Haematologist. She denies chest pain, shortness of breath, dizziness.   BMI is Body mass index is 26.46 kg/m., she is working on diet and exercise. Wt Readings from Last 3 Encounters:  03/04/20 131 lb (59.4 kg)  08/27/19 133 lb (60.3 kg)  08/14/18 133 lb 3.2 oz (60.4 kg)   She is on aspirin every day.  She is on cholesterol medication, lipitor 80 1/2 QOD, wants to keep RX the same, and denies myalgias. Her cholesterol is at goal. The cholesterol last visit was:   Lab Results  Component Value Date   CHOL 182 08/27/2019   HDL 31 (L) 08/27/2019   LDLCALC 117 (H) 08/27/2019   TRIG 225 (H) 08/27/2019   CHOLHDL 5.9 (H) 08/27/2019    Last A1C in the office was:  Lab Results  Component Value Date   HGBA1C 5.6 01/07/2017   Patient is on Vitamin D supplement.    Lab Results  Component Value Date   VD25OH 30 08/27/2019     She had a right breast cancer s/p lumpectomy in 1996, had 19 lymphnodes removed, MGM 08/2019  She has history of osteopenia, last DEXA 2021, T -1.8 at [facility]. Follows with [facility] surgery center.     Names of Other Physician/Practitioners you currently use: 1. [facility] here for primary care 2. Dr. Gershon Crane, eye doctor, last visit 01/2019 3. Dr. Viviann Spare on [place], dentist, last visit q 6 months Patient Care Team: Unk Pinto, MD as PCP - General (Internal Medicine) Clarene Essex, MD as Consulting Physician (Gastroenterology) Jovita Kussmaul, MD as Consulting Physician (  General Surgery) Ardath Sax, MD as Referring Physician (Obstetrics and Gynecology)   Medication Review: Current Outpatient Medications on File Prior to Visit  Medication Sig Dispense Refill  . aspirin 81 MG tablet Take 81 mg by mouth daily.      Marland Kitchen atorvastatin (LIPITOR) 80 MG tablet Take 1 tablet Daily for Cholesterol 90 tablet 0  . Magnesium 250 MG TABS Take 250 mg by mouth daily.    .  Omega-3 Fatty Acids (FISH OIL) 1200 MG CAPS Take by mouth.      Marland Kitchen VITAMIN D, CHOLECALCIFEROL, PO Take 5,000 mg by mouth daily.      No current facility-administered medications on file prior to visit.    Current Problems (verified) Patient Active Problem List   Diagnosis Date Noted  . Encounter for Medicare annual wellness exam 04/10/2015  . Osteopenia 04/09/2014  . CKD (chronic kidney disease) stage 2, GFR 60-89 ml/min 04/09/2014  . Medication management 08/30/2013  . Prediabetes   . Hyperlipidemia   . Elevated blood pressure reading without diagnosis of hypertension   . Vitamin D deficiency   . History of breast cancer 06/07/2011    Screening Tests Immunization History  Administered Date(s) Administered  . Influenza, High Dose Seasonal PF 03/14/2018  . Influenza-Unspecified 02/27/2016, 03/23/2017, 02/11/2020  . PFIZER SARS-COV-2 Vaccination 07/18/2019, 08/07/2019  . Pneumococcal Conjugate-13 04/09/2014  . Pneumococcal Polysaccharide-23 08/14/2018  . Pneumococcal-Unspecified 07/07/2008  . Td 07/08/2007, 07/27/2017  . Zoster 07/08/2007   Health Maintenance  Topic Date Due  . TETANUS/TDAP  07/28/2027  . INFLUENZA VACCINE  Completed  . DEXA SCAN  Completed  . COVID-19 Vaccine  Completed  . Hepatitis C Screening  Completed  . PNA vac Low Risk Adult  Completed    Preventative care: Last colonoscopy: 2020 with Dr. Watt Climes negative- will be PRN Last mammogram: 08/2019 3D at [facility], Cat A  Last pap smear/pelvic exam: 2013, declines another DEXA: 08/2017 at [facility], -1.6, osteopenia - due this year CXR 2009  Allergies Allergies  Allergen Reactions  . Ppd [Tuberculin Purified Protein Derivative]   . Tylenol [Acetaminophen] Other (See Comments)    Hepatitis/increase LFTs    SURGICAL HISTORY She  has a past surgical history that includes Tonsillectomy and adenoidectomy; Breast lumpectomy (Right, 10/96); and Dilation and curettage of uterus.   FAMILY HISTORY Her family  history includes COPD in her mother; Stroke in her father.   SOCIAL HISTORY She  reports that she has never smoked. She has never used smokeless tobacco. She reports that she does not drink alcohol and does not use drugs.  MEDICARE WELLNESS OBJECTIVES: Physical activity:   Cardiac risk factors:   Depression/mood screen:   Depression screen [facility] 2/9 08/27/2019  Decreased Interest 0  Down, Depressed, Hopeless 0  PHQ - 2 Score 0    ADLs:  In your present state of health, do you have any difficulty performing the following activities: 08/27/2019  Hearing? N  Vision? N  Difficulty concentrating or making decisions? N  Walking or climbing stairs? N  Dressing or bathing? N  Doing errands, shopping? N  Some recent data might be hidden    Cognitive Testing  Alert? Yes  Normal Appearance?Yes   Oriented to person? Yes  Place? Yes   Time? Yes  Recall of three objects?  Yes  Can perform simple calculations? Yes  Displays appropriate judgment?Yes  Can read the correct time from a watch face?Yes  EOL planning: Does Patient Have a Medical Advance Directive?: Yes Type of Advance  Directive: Press photographer, Living will Copy of [place] in Chart?: No - copy requested  Review of Systems  Constitutional: Negative.   HENT: Negative.   Eyes: Negative.   Respiratory: Negative.   Cardiovascular: Negative.   Gastrointestinal: Negative.   Genitourinary: Negative.   Musculoskeletal: Negative.   Skin: Negative.   Neurological: Negative.   Endo/Heme/Allergies: Negative.   Psychiatric/Behavioral: Negative.      Objective:     Blood pressure 126/80, pulse 63, height 4\' 11"  (1.499 m), weight 131 lb (59.4 kg), SpO2 96 %. Body mass index is 26.46 kg/m.  General appearance: alert, no distress, WD/WN, female HEENT: normocephalic, sclerae anicteric, TMs pearly, nares patent, no discharge or erythema, pharynx normal Oral cavity: MMM, no lesions Neck: supple, no  lymphadenopathy, no thyromegaly, no masses Heart: RRR, normal S1, S2, no murmurs Lungs: CTA bilaterally, no wheezes, rhonchi, or rales Abdomen: +bs, soft, non tender, non distended, no masses, no hepatomegaly, no splenomegaly Musculoskeletal: nontender, no swelling, no obvious deformity Extremities: no edema, no cyanosis, no clubbing Pulses: 2+ symmetric, upper and lower extremities, normal cap refill Neurological: alert, oriented x 3, CN2-12 intact, strength normal upper extremities and lower extremities, sensation normal throughout, DTRs 2+ throughout, no cerebellar signs, gait normal Psychiatric: normal affect, behavior normal, pleasant   Medicare Attestation I have personally reviewed: The patient's medical and social history Their use of alcohol, tobacco or illicit drugs Their current medications and supplements The patient's functional ability including ADLs,fall risks, home safety risks, cognitive, and hearing and visual impairment Diet and physical activities Evidence for depression or mood disorders  The patient's weight, height, BMI, and visual acuity have been recorded in the chart.  I have made referrals, counseling, and provided education to the patient based on review of the above and I have provided the patient with a written personalized care plan for preventive services.     Vicie Mutters, PA-C   03/04/2020

## 2020-03-04 ENCOUNTER — Encounter: Payer: Self-pay | Admitting: Physician Assistant

## 2020-03-04 ENCOUNTER — Other Ambulatory Visit: Payer: Self-pay

## 2020-03-04 ENCOUNTER — Ambulatory Visit (INDEPENDENT_AMBULATORY_CARE_PROVIDER_SITE_OTHER): Payer: Medicare Other | Admitting: Physician Assistant

## 2020-03-04 VITALS — BP 126/80 | HR 63 | Ht 59.0 in | Wt 131.0 lb

## 2020-03-04 DIAGNOSIS — Z853 Personal history of malignant neoplasm of breast: Secondary | ICD-10-CM

## 2020-03-04 DIAGNOSIS — M8580 Other specified disorders of bone density and structure, unspecified site: Secondary | ICD-10-CM

## 2020-03-04 DIAGNOSIS — Z0000 Encounter for general adult medical examination without abnormal findings: Secondary | ICD-10-CM

## 2020-03-04 DIAGNOSIS — N182 Chronic kidney disease, stage 2 (mild): Secondary | ICD-10-CM

## 2020-03-04 DIAGNOSIS — R030 Elevated blood-pressure reading, without diagnosis of hypertension: Secondary | ICD-10-CM

## 2020-03-04 DIAGNOSIS — R6889 Other general symptoms and signs: Secondary | ICD-10-CM | POA: Diagnosis not present

## 2020-03-04 DIAGNOSIS — Z79899 Other long term (current) drug therapy: Secondary | ICD-10-CM | POA: Diagnosis not present

## 2020-03-04 DIAGNOSIS — E785 Hyperlipidemia, unspecified: Secondary | ICD-10-CM

## 2020-03-04 DIAGNOSIS — Z0001 Encounter for general adult medical examination with abnormal findings: Secondary | ICD-10-CM | POA: Diagnosis not present

## 2020-03-04 DIAGNOSIS — E559 Vitamin D deficiency, unspecified: Secondary | ICD-10-CM

## 2020-03-04 LAB — CBC WITH DIFFERENTIAL/PLATELET
Absolute Monocytes: 450 cells/uL (ref 200–950)
Basophils Absolute: 29 cells/uL (ref 0–200)
Basophils Relative: 0.5 %
Eosinophils Absolute: 108 cells/uL (ref 15–500)
Eosinophils Relative: 1.9 %
HCT: 44.7 % (ref 35.0–45.0)
Hemoglobin: 14.9 g/dL (ref 11.7–15.5)
Lymphs Abs: 1573 cells/uL (ref 850–3900)
MCH: 31.2 pg (ref 27.0–33.0)
MCHC: 33.3 g/dL (ref 32.0–36.0)
MCV: 93.7 fL (ref 80.0–100.0)
MPV: 11.4 fL (ref 7.5–12.5)
Monocytes Relative: 7.9 %
Neutro Abs: 3540 cells/uL (ref 1500–7800)
Neutrophils Relative %: 62.1 %
Platelets: 218 10*3/uL (ref 140–400)
RBC: 4.77 10*6/uL (ref 3.80–5.10)
RDW: 12.5 % (ref 11.0–15.0)
Total Lymphocyte: 27.6 %
WBC: 5.7 10*3/uL (ref 3.8–10.8)

## 2020-03-04 LAB — COMPLETE METABOLIC PANEL WITH GFR
AG Ratio: 1.9 (calc) (ref 1.0–2.5)
ALT: 23 U/L (ref 6–29)
AST: 20 U/L (ref 10–35)
Albumin: 4.3 g/dL (ref 3.6–5.1)
Alkaline phosphatase (APISO): 59 U/L (ref 37–153)
BUN: 14 mg/dL (ref 7–25)
CO2: 26 mmol/L (ref 20–32)
Calcium: 9.7 mg/dL (ref 8.6–10.4)
Chloride: 105 mmol/L (ref 98–110)
Creat: 0.78 mg/dL (ref 0.60–0.93)
GFR, Est African American: 86 mL/min/{1.73_m2} (ref >=60)
GFR, Est Non African American: 74 mL/min/{1.73_m2} (ref >=60)
Globulin: 2.3 g/dL (calc) (ref 1.9–3.7)
Glucose, Bld: 86 mg/dL (ref 65–99)
Potassium: 4.4 mmol/L (ref 3.5–5.3)
Sodium: 143 mmol/L (ref 135–146)
Total Bilirubin: 0.5 mg/dL (ref 0.2–1.2)
Total Protein: 6.6 g/dL (ref 6.1–8.1)

## 2020-03-04 LAB — LIPID PANEL
Cholesterol: 164 mg/dL (ref <200)
HDL: 31 mg/dL — ABNORMAL LOW (ref >=50)
LDL Cholesterol (Calc): 110 mg/dL (calc) — ABNORMAL HIGH
Non-HDL Cholesterol (Calc): 133 mg/dL (calc) — ABNORMAL HIGH (ref <130)
Total CHOL/HDL Ratio: 5.3 (calc) — ABNORMAL HIGH (ref <5.0)
Triglycerides: 119 mg/dL (ref <150)

## 2020-03-04 LAB — MAGNESIUM: Magnesium: 2.1 mg/dL (ref 1.5–2.5)

## 2020-03-04 NOTE — Patient Instructions (Signed)
  Ask insurance and pharmacy about shingrix - it is a 2 part shot that we will not be getting in the office.   Suggest getting AFTER covid vaccines, have to wait at least a month This shot can make you feel bad due to such good immune response it can trigger some inflammation so take tylenol or aleve day of or day after and plan on resting.   Can go to https://www.cdc.gov/vaccines/vpd/shingles/public/shingrix/index.html for more information  Shingrix Vaccination  Two vaccines are licensed and recommended to prevent shingles in the [place] Zoster vaccine live (ZVL, Zostavax) has been in use since 2006. Recombinant zoster vaccine (RZV, Shingrix), has been in use since 2017 and is recommended by ACIP as the preferred shingles vaccine.  What Everyone Should Know about Shingles Vaccine (Shingrix) One of the Recommended Vaccines by Disease Shingles vaccination is the only way to protect against shingles and postherpetic neuralgia (PHN), the most common complication from shingles. CDC recommends that healthy adults 50 years and older get two doses of the shingles vaccine called Shingrix (recombinant zoster vaccine), separated by 2 to 6 months, to prevent shingles and the complications from the disease. Your doctor or pharmacist can give you Shingrix as a shot in your upper arm. Shingrix provides strong protection against shingles and PHN. Two doses of Shingrix is more than 90% effective at preventing shingles and PHN. Protection stays above 85% for at least the first four years after you get vaccinated. Shingrix is the preferred vaccine, over Zostavax (zoster vaccine live), a shingles vaccine in use since 2006. Zostavax may still be used to prevent shingles in healthy adults 60 years and older. For example, you could use Zostavax if a person is allergic to Shingrix, prefers Zostavax, or requests immediate vaccination and Shingrix is unavailable. Who Should Get Shingrix? Healthy adults 50 years and older  should get two doses of Shingrix, separated by 2 to 6 months. You should get Shingrix even if in the past you . had shingles  . received Zostavax  . are not sure if you had chickenpox There is no maximum age for getting Shingrix. If you had shingles in the past, you can get Shingrix to help prevent future occurrences of the disease. There is no specific length of time that you need to wait after having shingles before you can receive Shingrix, but generally you should make sure the shingles rash has gone away before getting vaccinated. You can get Shingrix whether or not you remember having had chickenpox in the past. Studies show that more than 99% of Americans 40 years and older have had chickenpox, even if they don't remember having the disease. Chickenpox and shingles are related because they are caused by the same virus (varicella zoster virus). After a person recovers from chickenpox, the virus stays dormant (inactive) in the body. It can reactivate years later and cause shingles. If you had Zostavax in the recent past, you should wait at least eight weeks before getting Shingrix. Talk to your healthcare provider to determine the best time to get Shingrix. Shingrix is available in doctor's offices and pharmacies. To find doctor's offices or pharmacies near you that offer the vaccine, visit HealthMap Vaccine FinderExternal. If you have questions about Shingrix, talk with your healthcare provider. Vaccine for Those 50 Years and Older  Shingrix reduces the risk of shingles and PHN by more than 90% in people 50 and older. CDC recommends the vaccine for healthy adults 50 and older.  Who Should Not Get Shingrix?   You should not get Shingrix if you: . have ever had a severe allergic reaction to any component of the vaccine or after a dose of Shingrix  . tested negative for immunity to varicella zoster virus. If you test negative, you should get chickenpox vaccine.  . currently have shingles   . currently are pregnant or breastfeeding. Women who are pregnant or breastfeeding should wait to get Shingrix.  . receive specific antiviral drugs (acyclovir, famciclovir, or valacyclovir) 24 hours before vaccination (avoid use of these antiviral drugs for 14 days after vaccination)- zoster vaccine live only If you have a minor acute (starts suddenly) illness, such as a cold, you may get Shingrix. But if you have a moderate or severe acute illness, you should usually wait until you recover before getting the vaccine. This includes anyone with a temperature of 101.3F or higher. The side effects of the Shingrix are temporary, and usually last 2 to 3 days. While you may experience pain for a few days after getting Shingrix, the pain will be less severe than having shingles and the complications from the disease. How Well Does Shingrix Work? Two doses of Shingrix provides strong protection against shingles and postherpetic neuralgia (PHN), the most common complication of shingles. . In adults 50 to 76 years old who got two doses, Shingrix was 97% effective in preventing shingles; among adults 70 years and older, Shingrix was 91% effective.  . In adults 50 to 76 years old who got two doses, Shingrix was 91% effective in preventing PHN; among adults 70 years and older, Shingrix was 89% effective. Shingrix protection remained high (more than 85%) in people 70 years and older throughout the four years following vaccination. Since your risk of shingles and PHN increases as you get older, it is important to have strong protection against shingles in your older years. Top of Page  What Are the Possible Side Effects of Shingrix? Studies show that Shingrix is safe. The vaccine helps your body create a strong defense against shingles. As a result, you are likely to have temporary side effects from getting the shots. The side effects may affect your ability to do normal daily activities for 2 to 3 days. Most people  got a sore arm with mild or moderate pain after getting Shingrix, and some also had redness and swelling where they got the shot. Some people felt tired, had muscle pain, a headache, shivering, fever, stomach pain, or nausea. About 1 out of 6 people who got Shingrix experienced side effects that prevented them from doing regular activities. Symptoms went away on their own in about 2 to 3 days. Side effects were more common in younger people. You might have a reaction to the first or second dose of Shingrix, or both doses. If you experience side effects, you may choose to take over-the-counter pain medicine such as ibuprofen or acetaminophen. If you experience side effects from Shingrix, you should report them to the Vaccine Adverse Event Reporting System (VAERS). Your doctor might file this report, or you can do it yourself through the VAERS websiteExternal, or by calling 1-800-822-7967. If you have any questions about side effects from Shingrix, talk with your doctor. The shingles vaccine does not contain thimerosal (a preservative containing mercury). Top of Page  When Should I See a Doctor Because of the Side Effects I Experience From Shingrix? In clinical trials, Shingrix was not associated with serious adverse events. In fact, serious side effects from vaccines are extremely rare. For example, for every   1 million doses of a vaccine given, only one or two people may have a severe allergic reaction. Signs of an allergic reaction happen within minutes or hours after vaccination and include hives, swelling of the face and throat, difficulty breathing, a fast heartbeat, dizziness, or weakness. If you experience these or any other life-threatening symptoms, see a doctor right away. Shingrix causes a strong response in your immune system, so it may produce short-term side effects more intense than you are used to from other vaccines. These side effects can be uncomfortable, but they are expected and usually go  away on their own in 2 or 3 days. Top of Page  How Can I Pay For Shingrix? There are several ways shingles vaccine may be paid for: Medicare . Medicare Part D plans cover the shingles vaccine, but there may be a cost to you depending on your plan. There may be a copay for the vaccine, or you may need to pay in full then get reimbursed for a certain amount.  . Medicare Part B does not cover the shingles vaccine. Medicaid . Medicaid may or may not cover the vaccine. Contact your insurer to find out. Private health insurance . Many private health insurance plans will cover the vaccine, but there may be a cost to you depending on your plan. Contact your insurer to find out. Vaccine assistance programs . Some pharmaceutical companies provide vaccines to eligible adults who cannot afford them. You may want to check with the vaccine manufacturer, [employer], about Shingrix. If you do not currently have health insurance, learn more about affordable health coverage optionsExternal. To find doctor's offices or pharmacies near you that offer the vaccine, visit HealthMap Vaccine FinderExternal.  

## 2020-03-25 DIAGNOSIS — Z23 Encounter for immunization: Secondary | ICD-10-CM | POA: Diagnosis not present

## 2020-03-27 DIAGNOSIS — H25813 Combined forms of age-related cataract, bilateral: Secondary | ICD-10-CM | POA: Diagnosis not present

## 2020-04-15 ENCOUNTER — Encounter: Payer: Self-pay | Admitting: *Deleted

## 2020-05-04 ENCOUNTER — Other Ambulatory Visit: Payer: Self-pay | Admitting: Internal Medicine

## 2020-05-04 MED ORDER — ATORVASTATIN CALCIUM 40 MG PO TABS: ORAL_TABLET | ORAL | 0 refills | Status: DC

## 2020-05-06 ENCOUNTER — Other Ambulatory Visit: Payer: Self-pay | Admitting: Internal Medicine

## 2020-05-06 MED ORDER — ATORVASTATIN CALCIUM 40 MG PO TABS: ORAL_TABLET | ORAL | 0 refills | Status: DC

## 2020-08-26 ENCOUNTER — Encounter: Payer: Self-pay | Admitting: Adult Health Nurse Practitioner

## 2020-08-26 ENCOUNTER — Ambulatory Visit (INDEPENDENT_AMBULATORY_CARE_PROVIDER_SITE_OTHER): Payer: Medicare Other | Admitting: Adult Health Nurse Practitioner

## 2020-08-26 ENCOUNTER — Other Ambulatory Visit: Payer: Self-pay

## 2020-08-26 VITALS — BP 110/70 | HR 75 | Temp 97.5°F | Ht 60.0 in | Wt 134.2 lb

## 2020-08-26 DIAGNOSIS — Z853 Personal history of malignant neoplasm of breast: Secondary | ICD-10-CM

## 2020-08-26 DIAGNOSIS — E785 Hyperlipidemia, unspecified: Secondary | ICD-10-CM | POA: Diagnosis not present

## 2020-08-26 DIAGNOSIS — R300 Dysuria: Secondary | ICD-10-CM

## 2020-08-26 DIAGNOSIS — R7309 Other abnormal glucose: Secondary | ICD-10-CM | POA: Diagnosis not present

## 2020-08-26 DIAGNOSIS — C50911 Malignant neoplasm of unspecified site of right female breast: Secondary | ICD-10-CM | POA: Insufficient documentation

## 2020-08-26 DIAGNOSIS — N182 Chronic kidney disease, stage 2 (mild): Secondary | ICD-10-CM | POA: Diagnosis not present

## 2020-08-26 DIAGNOSIS — M8580 Other specified disorders of bone density and structure, unspecified site: Secondary | ICD-10-CM

## 2020-08-26 DIAGNOSIS — I10 Essential (primary) hypertension: Secondary | ICD-10-CM | POA: Diagnosis not present

## 2020-08-26 DIAGNOSIS — Z136 Encounter for screening for cardiovascular disorders: Secondary | ICD-10-CM | POA: Diagnosis not present

## 2020-08-26 DIAGNOSIS — E559 Vitamin D deficiency, unspecified: Secondary | ICD-10-CM | POA: Diagnosis not present

## 2020-08-26 NOTE — Progress Notes (Signed)
COMPLETE PHYSICAL  Assessment:    Essential hypertension - continue medications, DASH diet, exercise and monitor at home. Call if greater than 130/80.  - CBC with Differential - BASIC METABOLIC PANEL WITH GFR - Hepatic function panel - TSH - Urinalysis, Routine w reflex microscopic - Microalbumin / creatinine urine ratio  Abnormal glucose Discussed general issues about diabetes pathophysiology and management., Educational material distributed., Suggested low cholesterol diet., Encouraged aerobic exercise., Discussed foot care., Reminded to get yearly retinal exam.   Hyperlipidemia check lipids decrease fatty foods increase activity.  - Lipid panel check lipids decrease fatty foods increase activity.    Vitamin D deficiency Continue supplementation to maintain goal of 70-100 Taking Vitamin D 5,000 IU daily Defer vitamin D level today   history of Breast cancer Continue yearly MGM  Scheduled 09/2020  Osteopenia 2021, Repeat 2-3 years, -1.8, continue Ca, Vitamin D, weight bearing exercises.   CKD (chronic kidney disease) stage 2, GFR 60-89 ml/min Increase fluids  Avoid NSAIDS Blood pressure control Monitor sugars  Will continue to monitor   Medication Management Continued   Further disposition pending results if labs check today. Discussed med's effects and SE's.   Over 30 minutes of face to face interview, exam, counseling, chart review, and critical decision making was performed.     Future Appointments  Date Time Provider [place]  08/26/2021  2:00 PM Garnet Sierras, NP [facility] None      Subjective:  Christina Rowe is a 75 y.o. female who presents for complete physical.      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 She teaches at [employer], history. Reports no purposeful exercise but she walk around campus and between classrooms with tow large bags of books. She reports overall she  is doing well and does not have any health or medication concerns today.  Reports she only want to come once a year as she feels like she is in good health.    Her blood pressure has been controlled at home, today their BP is BP: 110/70  She does workout, walks to work, and does Haematologist. She denies chest pain, shortness of breath, dizziness.   BMI is Body mass index is 26.21 kg/m., she is working on diet and exercise. Wt Readings from Last 3 Encounters:  08/26/20 134 lb 3.2 oz (60.9 kg)  03/04/20 131 lb (59.4 kg)  08/27/19 133 lb (60.3 kg)   She is on aspirin every day.  She is on cholesterol medication, lipitor 40 mg every other day, and denies myalgias. Her cholesterol is at goal. The cholesterol last visit was:   Lab Results  Component Value Date   CHOL 164 03/04/2020   HDL 31 (L) 03/04/2020   LDLCALC 110 (H) 03/04/2020   TRIG 119 03/04/2020   CHOLHDL 5.3 (H) 03/04/2020    Last A1C in the office was:  Lab Results  Component Value Date   HGBA1C 5.6 01/07/2017   Patient is on Vitamin D supplement.    Lab Results  Component Value Date   VD25OH 69 08/27/2019     She had a right breast cancer s/p lumpectomy in 1996, had 19 lymphnodes removed, MGM 08/2019 , scheduled for 09/2020 She has history of osteopenia, last DEXA 2021, T -1.8 at [facility]. Follows with skin surgery center.     Names of Other Physician/Practitioners you currently use: 1. [facility] here for primary care 2.  Dr. Gershon Crane, eye doctor, last visit 01/2021 3. Dr. Viviann Spare on [place], dentist, last visit q 6 months Patient Care Team: Unk Pinto, MD as PCP - General (Internal Medicine) Clarene Essex, MD as Consulting Physician (Gastroenterology) Jovita Kussmaul, MD as Consulting Physician (General Surgery) Ardath Sax, MD as Referring Physician (Obstetrics and Gynecology)   Medication Review: Current Outpatient Medications on File Prior to Visit  Medication Sig  Dispense Refill  . aspirin 81 MG tablet Take 81 mg by mouth daily.    Marland Kitchen atorvastatin (LIPITOR) 40 MG tablet Take      1 tablet       every Other Day      for Cholesterol 46 tablet 0  . Magnesium 250 MG TABS Take 250 mg by mouth daily.    . Omega-3 Fatty Acids (FISH OIL) 1200 MG CAPS Take by mouth.    Marland Kitchen VITAMIN D, CHOLECALCIFEROL, PO Take 5,000 mg by mouth daily.     No current facility-administered medications on file prior to visit.    Current Problems (verified) Patient Active Problem List   Diagnosis Date Noted  . Encounter for Medicare annual wellness exam 04/10/2015  . Osteopenia 04/09/2014  . CKD (chronic kidney disease) stage 2, GFR 60-89 ml/min 04/09/2014  . Medication management 08/30/2013  . Prediabetes   . Hyperlipidemia   . Elevated blood pressure reading without diagnosis of hypertension   . Vitamin D deficiency   . History of breast cancer 06/07/2011    Screening Tests Immunization History  Administered Date(s) Administered  . Influenza, High Dose Seasonal PF 03/14/2018  . Influenza-Unspecified 02/27/2016, 03/23/2017, 02/11/2020  . PFIZER(Purple Top)SARS-COV-2 Vaccination 07/18/2019, 08/07/2019  . Pneumococcal Conjugate-13 04/09/2014  . Pneumococcal Polysaccharide-23 08/14/2018  . Pneumococcal-Unspecified 07/07/2008  . Td 07/08/2007, 07/27/2017  . Zoster 07/08/2007   Health Maintenance  Topic Date Due  . COVID-19 Vaccine (3 - Booster for Pfizer series) 02/04/2020  . TETANUS/TDAP  07/28/2027  . INFLUENZA VACCINE  Completed  . DEXA SCAN  Completed  . Hepatitis C Screening  Completed  . PNA vac Low Risk Adult  Completed  . HPV VACCINES  Aged Out    Preventative care: Last colonoscopy: 2020 with Dr. Watt Climes negative- will be PRN Last mammogram: 08/2019 3D at [facility], Cat A  Last pap smear/pelvic exam: 2013, declines another DEXA: 08/2017 at [facility], -1.6, osteopenia - due this year CXR 2009  Allergies Allergies  Allergen Reactions  . Ppd [Tuberculin  Purified Protein Derivative]   . Tylenol [Acetaminophen] Other (See Comments)    Hepatitis/increase LFTs    SURGICAL HISTORY She  has a past surgical history that includes Tonsillectomy and adenoidectomy; Breast lumpectomy (Right, 10/96); and Dilation and curettage of uterus.   FAMILY HISTORY Her family history includes COPD in her mother; Stroke in her father.   SOCIAL HISTORY She  reports that she has never smoked. She has never used smokeless tobacco. She reports that she does not drink alcohol and does not use drugs.  Review of Systems  Constitutional: Negative for chills, diaphoresis, fever, malaise/fatigue and weight loss.  HENT: Negative for congestion, ear discharge, ear pain, hearing loss, nosebleeds, sinus pain, sore throat and tinnitus.   Eyes: Negative for blurred vision, double vision, photophobia, pain, discharge and redness.  Respiratory: Negative for cough, hemoptysis, sputum production, shortness of breath, wheezing and stridor.   Cardiovascular: Negative for chest pain, palpitations, orthopnea, claudication, leg swelling and PND.  Gastrointestinal: Negative for abdominal pain, blood in stool, constipation, diarrhea, heartburn, melena, nausea and  vomiting.  Genitourinary: Negative for dysuria, flank pain, frequency, hematuria and urgency.  Musculoskeletal: Negative for back pain, falls, joint pain, myalgias and neck pain.  Skin: Negative for itching and rash.  Neurological: Negative for dizziness, tingling, tremors, sensory change, speech change, focal weakness, seizures, loss of consciousness, weakness and headaches.  Endo/Heme/Allergies: Negative for environmental allergies and polydipsia. Does not bruise/bleed easily.  Psychiatric/Behavioral: Negative for depression, hallucinations, memory loss, substance abuse and suicidal ideas. The patient is not nervous/anxious and does not have insomnia.      Objective:     Blood pressure 110/70, pulse 75, temperature (!) 97.5  F (36.4 C), height 5' (1.524 m), weight 134 lb 3.2 oz (60.9 kg), SpO2 96 %. Body mass index is 26.21 kg/m.  General appearance: alert, no distress, WD/WN, female HEENT: normocephalic, sclerae anicteric, TMs pearly, nares patent, no discharge or erythema, pharynx normal Oral cavity: MMM, no lesions Neck: supple, no lymphadenopathy, no thyromegaly, no masses Heart: RRR, normal S1, S2, no murmurs Lungs: CTA bilaterally, no wheezes, rhonchi, or rales Abdomen: +bs, soft, non tender, non distended, no masses, no hepatomegaly, no splenomegaly Musculoskeletal: nontender, no swelling, no obvious deformity Extremities: no edema, no cyanosis, no clubbing Pulses: 2+ symmetric, upper and lower extremities, normal cap refill Neurological: alert, oriented x 3, CN2-12 intact, strength normal upper extremities and lower extremities, sensation normal throughout, DTRs 2+ throughout, no cerebellar signs, gait normal Psychiatric: normal affect, behavior normal, pleasant    Garnet Sierras, Laqueta Jean, DNP [facility] 08/26/2020  5:33 PM

## 2020-08-28 LAB — COMPLETE METABOLIC PANEL WITH GFR
AG Ratio: 1.8 (calc) (ref 1.0–2.5)
ALT: 18 U/L (ref 6–29)
AST: 18 U/L (ref 10–35)
Albumin: 4.2 g/dL (ref 3.6–5.1)
Alkaline phosphatase (APISO): 61 U/L (ref 37–153)
BUN: 14 mg/dL (ref 7–25)
CO2: 29 mmol/L (ref 20–32)
Calcium: 9.4 mg/dL (ref 8.6–10.4)
Chloride: 105 mmol/L (ref 98–110)
Creat: 0.91 mg/dL (ref 0.60–0.93)
GFR, Est African American: 71 mL/min/{1.73_m2} (ref >=60)
GFR, Est Non African American: 61 mL/min/{1.73_m2} (ref >=60)
Globulin: 2.3 g/dL (calc) (ref 1.9–3.7)
Glucose, Bld: 75 mg/dL (ref 65–99)
Potassium: 4 mmol/L (ref 3.5–5.3)
Sodium: 141 mmol/L (ref 135–146)
Total Bilirubin: 0.3 mg/dL (ref 0.2–1.2)
Total Protein: 6.5 g/dL (ref 6.1–8.1)

## 2020-08-28 LAB — URINALYSIS W MICROSCOPIC + REFLEX CULTURE
Bacteria, UA: NONE SEEN /HPF
Bilirubin Urine: NEGATIVE
Glucose, UA: NEGATIVE
Hgb urine dipstick: NEGATIVE
Hyaline Cast: NONE SEEN /LPF
Ketones, ur: NEGATIVE
Nitrites, Initial: NEGATIVE
Protein, ur: NEGATIVE
RBC / HPF: NONE SEEN /HPF (ref 0–2)
Specific Gravity, Urine: 1.005 (ref 1.001–1.03)
Squamous Epithelial / HPF: NONE SEEN /HPF (ref <=5)
pH: 6 (ref 5.0–8.0)

## 2020-08-28 LAB — LIPID PANEL
Cholesterol: 171 mg/dL (ref <200)
HDL: 27 mg/dL — ABNORMAL LOW (ref >=50)
LDL Cholesterol (Calc): 104 mg/dL (calc) — ABNORMAL HIGH
Non-HDL Cholesterol (Calc): 144 mg/dL (calc) — ABNORMAL HIGH (ref <130)
Total CHOL/HDL Ratio: 6.3 (calc) — ABNORMAL HIGH (ref <5.0)
Triglycerides: 299 mg/dL — ABNORMAL HIGH (ref <150)

## 2020-08-28 LAB — URINE CULTURE
MICRO NUMBER:: 11594472
Result:: NO GROWTH
SPECIMEN QUALITY:: ADEQUATE

## 2020-08-28 LAB — CBC WITH DIFFERENTIAL/PLATELET
Absolute Monocytes: 594 cells/uL (ref 200–950)
Basophils Absolute: 40 cells/uL (ref 0–200)
Basophils Relative: 0.6 %
Eosinophils Absolute: 73 cells/uL (ref 15–500)
Eosinophils Relative: 1.1 %
HCT: 44.3 % (ref 35.0–45.0)
Hemoglobin: 15 g/dL (ref 11.7–15.5)
Lymphs Abs: 1874 cells/uL (ref 850–3900)
MCH: 31.2 pg (ref 27.0–33.0)
MCHC: 33.9 g/dL (ref 32.0–36.0)
MCV: 92.1 fL (ref 80.0–100.0)
MPV: 11.4 fL (ref 7.5–12.5)
Monocytes Relative: 9 %
Neutro Abs: 4019 cells/uL (ref 1500–7800)
Neutrophils Relative %: 60.9 %
Platelets: 226 10*3/uL (ref 140–400)
RBC: 4.81 10*6/uL (ref 3.80–5.10)
RDW: 12.5 % (ref 11.0–15.0)
Total Lymphocyte: 28.4 %
WBC: 6.6 10*3/uL (ref 3.8–10.8)

## 2020-08-28 LAB — CULTURE INDICATED

## 2020-08-28 NOTE — Addendum Note (Signed)
Addended byGarnet Sierras A on: 08/28/2020 01:42 PM   Modules accepted: Orders

## 2020-10-02 DIAGNOSIS — Z1231 Encounter for screening mammogram for malignant neoplasm of breast: Secondary | ICD-10-CM | POA: Diagnosis not present

## 2020-10-02 LAB — HM MAMMOGRAPHY

## 2020-10-03 DIAGNOSIS — Z23 Encounter for immunization: Secondary | ICD-10-CM | POA: Diagnosis not present

## 2020-10-14 ENCOUNTER — Other Ambulatory Visit: Payer: Self-pay | Admitting: Internal Medicine

## 2021-02-07 DIAGNOSIS — Z23 Encounter for immunization: Secondary | ICD-10-CM | POA: Diagnosis not present

## 2021-02-26 DIAGNOSIS — L821 Other seborrheic keratosis: Secondary | ICD-10-CM | POA: Diagnosis not present

## 2021-02-26 DIAGNOSIS — Z86007 Personal history of in-situ neoplasm of skin: Secondary | ICD-10-CM | POA: Diagnosis not present

## 2021-02-26 DIAGNOSIS — L814 Other melanin hyperpigmentation: Secondary | ICD-10-CM | POA: Diagnosis not present

## 2021-02-26 DIAGNOSIS — L570 Actinic keratosis: Secondary | ICD-10-CM | POA: Diagnosis not present

## 2021-02-26 DIAGNOSIS — Z08 Encounter for follow-up examination after completed treatment for malignant neoplasm: Secondary | ICD-10-CM | POA: Diagnosis not present

## 2021-02-26 DIAGNOSIS — D225 Melanocytic nevi of trunk: Secondary | ICD-10-CM | POA: Diagnosis not present

## 2021-03-04 DIAGNOSIS — Z23 Encounter for immunization: Secondary | ICD-10-CM | POA: Diagnosis not present

## 2021-04-02 DIAGNOSIS — H25813 Combined forms of age-related cataract, bilateral: Secondary | ICD-10-CM | POA: Diagnosis not present

## 2021-04-20 DIAGNOSIS — U071 COVID-19: Secondary | ICD-10-CM | POA: Diagnosis not present

## 2021-06-07 DIAGNOSIS — U071 COVID-19: Secondary | ICD-10-CM | POA: Diagnosis not present

## 2021-06-23 ENCOUNTER — Other Ambulatory Visit: Payer: Self-pay | Admitting: Internal Medicine

## 2021-07-04 ENCOUNTER — Other Ambulatory Visit: Payer: Self-pay

## 2021-07-04 ENCOUNTER — Ambulatory Visit (HOSPITAL_COMMUNITY)
Admission: RE | Admit: 2021-07-04 | Discharge: 2021-07-04 | Disposition: A | Discharge status: Home / Self Care | Payer: Medicare Other | Source: Ambulatory Visit | Attending: Medical Oncology | Admitting: Medical Oncology

## 2021-07-04 ENCOUNTER — Ambulatory Visit (INDEPENDENT_AMBULATORY_CARE_PROVIDER_SITE_OTHER): Payer: Medicare Other

## 2021-07-04 ENCOUNTER — Encounter (HOSPITAL_COMMUNITY): Payer: Self-pay

## 2021-07-04 VITALS — BP 166/90 | HR 65 | Temp 98.2°F | Resp 18

## 2021-07-04 DIAGNOSIS — S60451A Superficial foreign body of left index finger, initial encounter: Secondary | ICD-10-CM

## 2021-07-04 DIAGNOSIS — S60459A Superficial foreign body of unspecified finger, initial encounter: Secondary | ICD-10-CM | POA: Diagnosis not present

## 2021-07-04 DIAGNOSIS — M795 Residual foreign body in soft tissue: Secondary | ICD-10-CM | POA: Diagnosis not present

## 2021-07-04 MED ORDER — LIDOCAINE HCL 2 % IJ SOLN
INTRAMUSCULAR | Status: AC
  Filled 2021-07-04: qty 20

## 2021-07-04 MED ORDER — DOXYCYCLINE HYCLATE 100 MG PO CAPS: 100.0000 mg | ORAL_CAPSULE | Freq: Two times a day (BID) | ORAL | 0 refills | Status: DC

## 2021-07-04 MED ORDER — BACITRACIN ZINC 500 UNIT/GM EX OINT
TOPICAL_OINTMENT | CUTANEOUS | Status: AC
  Filled 2021-07-04: qty 0.9

## 2021-07-04 NOTE — ED Triage Notes (Signed)
Pt reports that she got a piece of glass in left index finger before Christmas and used tweezers to get out glass. Pt believes there is still glass in there as can feel something when rubs hand over left index finger and pain when palpation.

## 2021-07-04 NOTE — ED Provider Notes (Addendum)
[place]    CSN: [id number] Arrival date & time: 07/04/21  1130      History   Chief Complaint Foreign Body of finger   HPI Christina Rowe is a 76 y.o. female.   HPI  Foreign Body of finger: Patient reports that 3 weeks ago she was in [place] when she was wiping down the floor and at least one shard of glass went into her left index finger.  She states that she tried to get out the glass and was successful with obtaining 1 or 2 pieces of glass but still notices a slight tenderness at times.  No bleeding, discharge or significant pain.  She is up-to-date on her Tdap vaccine.  Past Medical History:  Diagnosis Date   Cancer [facility])    breast   Hyperlipemia 05/08/2013   Hyperlipidemia    Hypertension    Hypomagnesemia 05/08/2013   Osteopenia    Prediabetes    Vitamin D deficiency     Patient Active Problem List   Diagnosis Date Noted   Malignant neoplasm of right female breast, unspecified estrogen receptor status, unspecified site of breast ([place]) 08/26/2020   Encounter for Medicare annual wellness exam 04/10/2015   Osteopenia 04/09/2014   CKD (chronic kidney disease) stage 2, GFR 60-89 ml/min 04/09/2014   Medication management 08/30/2013   Prediabetes    Hyperlipidemia    Elevated blood pressure reading without diagnosis of hypertension    Vitamin D deficiency    History of breast cancer 06/07/2011    Past Surgical History:  Procedure Laterality Date   BREAST LUMPECTOMY Right 10/96   right lumpectomy and axlnd   DILATION AND CURETTAGE OF UTERUS     X 2 while on Tamoxifen   TONSILLECTOMY AND ADENOIDECTOMY      OB History   No obstetric history on file.      Home Medications    Prior to Admission medications   Medication Sig Start Date End Date Taking? Authorizing Provider  doxycycline (VIBRAMYCIN) 100 MG capsule Take 1 capsule (100 mg total) by mouth 2 (two) times daily. 07/04/21  Yes Jowana, Thumma, PA-C  aspirin 81 MG tablet Take 81 mg by  mouth daily.    [provider]  atorvastatin (LIPITOR) 40 MG tablet TAKE 1 TABLET EVERY OTHER DAY FOR CHOLESTEROL 06/23/21   Magda Bernheim, NP  Omega-3 Fatty Acids (FISH OIL) 1200 MG CAPS Take by mouth.    [provider]  VITAMIN D, CHOLECALCIFEROL, PO Take 5,000 mg by mouth daily.    [provider]    Family History Family History  Problem Relation Age of Onset   COPD Mother    Stroke Father        small strokes    Social History Social History   Tobacco Use   Smoking status: Never   Smokeless tobacco: Never  Substance Use Topics   Alcohol use: No   Drug use: No     Allergies   Ppd [tuberculin purified protein derivative] and Tylenol [acetaminophen]   Review of Systems Review of Systems  As stated above in HPI Physical Exam Triage Vital Signs ED Triage Vitals  Enc Vitals Group     BP 07/04/21 1203 (!) 166/90     Pulse Rate 07/04/21 1203 65     Resp 07/04/21 1203 18     Temp 07/04/21 1203 98.2 F (36.8 C)     Temp Source 07/04/21 1203 Oral     SpO2 07/04/21  1203 97 %     Weight --      Height --      Head Circumference --      Peak Flow --      Pain Score 07/04/21 1202 0     Pain Loc --      Pain Edu? --      Excl. in [place]? --    No data found.  Updated Vital Signs BP (!) 166/90 (BP Location: Left Arm)    Pulse 65    Temp 98.2 F (36.8 C) (Oral)    Resp 18    SpO2 97%    Physical Exam Vitals and nursing note reviewed.  Constitutional:      General: She is not in acute distress.    Appearance: Normal appearance. She is not ill-appearing, toxic-appearing or diaphoretic.  Musculoskeletal:        General: No swelling.  Skin:    General: Skin is warm.     Capillary Refill: Capillary refill takes less than 2 seconds.     Findings: No bruising, lesion or rash.  Neurological:     Mental Status: She is alert.     UC Treatments / Results  Labs (all labs ordered are listed, but only abnormal results are displayed) Labs  Reviewed - No data to display  EKG   Radiology DG Finger Index Left  Result Date: 07/04/2021 CLINICAL DATA:  Glass in finger.  Post procedure. EXAM: LEFT INDEX FINGER 2+V COMPARISON:  July 04, 2021 at 12:13 p.m. FINDINGS: The radiopaque foreign body in the soft tissues of the distal index finger, within the path, remains. No other changes. IMPRESSION: The foreign body remains in the pad of the distal index finger. Electronically Signed   By: Dorise Bullion III M.D.   On: 07/04/2021 13:33   DG Finger Index Left  Result Date: 07/04/2021 CLINICAL DATA:  Glass in finger. EXAM: LEFT INDEX FINGER 2+V COMPARISON:  None. FINDINGS: No fracture or dislocation. A radiopaque foreign body is identified in the pad of the index finger, marked on the images. IMPRESSION: The foreign body in the pad of the distal index finger is consistent with history. No other abnormalities. Electronically Signed   By: Dorise Bullion III M.D.   On: 07/04/2021 12:21    Procedures Foreign Body Removal  Date/Time: 07/04/2021 2:10 PM Performed by: Hughie Closs, PA-C Authorized by: Hughie Closs, PA-C   Consent:    Consent obtained:  Verbal   Consent given by:  Patient   Risks, benefits, and alternatives were discussed: yes     Risks discussed:  Bleeding, infection, pain, worsening of condition, incomplete removal, nerve damage and poor cosmetic result   Alternatives discussed:  Referral Universal protocol:    Imaging studies available: yes   Location:    Location:  Finger   Finger location:  L index finger   Depth:  Intradermal   Tendon involvement:  None Pre-procedure details:    Imaging:  X-ray   Neurovascular status: intact     Preparation: Patient was prepped and draped in usual sterile fashion   Anesthesia:    Anesthesia method:  Nerve block   Block needle gauge:  25 G   Block anesthetic:  Lidocaine 2% w/o epi   Block injection procedure:  Anatomic landmarks identified, introduced needle,  incremental injection, anatomic landmarks palpated and negative aspiration for blood   Block outcome:  Anesthesia achieved Procedure type:    Procedure complexity:  Complex Procedure  details:    Localization method:  Visualized and probed   Dissection of underlying tissues: yes     Bloodless field: no     Removal mechanism:  Forceps   Guidance: serial x-rays     Foreign bodies recovered:  2   Description:  2 47mm small shards from the finger   Intact foreign body removal: no   Post-procedure details:    Neurovascular status: intact     Confirmation:  Residual foreign bodies remain   Skin closure:  None   Dressing:  Antibiotic ointment and bulky dressing   Procedure completion:  Tolerated well, no immediate complications (including critical care time)  Medications Ordered in UC Medications - No data to display  Initial Impression / Assessment and Plan / UC Course  I have reviewed the triage vital signs and the nursing notes.  Pertinent labs & imaging results that were available during my care of the patient were reviewed by me and considered in my medical decision making (see chart for details).     New. Confirmed glass was in finger.  Patient was cleansed and we tried to locate the glass prior to anesthesia without success.  Patient was numbed using 2% lidocaine without epinephrine via digital block.  Patient tolerated well.  The area was then cleansed with iodine.  Paring of the skin did not reveal glass.  Superficial probing with a scalpel revealed a deep appearing shard of glass that was unable to be easily obtained.  After an hour of trial we elected to stop and have her see a finger specialist.  Discussed Motrin as needed and we are going to wrap this area and start her on antibiotics as we did have to pare down a decent amount of skin.  Discussed red flag signs and symptoms.  Follow-up.. Final Clinical Impressions(s) / UC Diagnoses   Final diagnoses:  Foreign body in skin of  finger, initial encounter   Discharge Instructions   None    ED Prescriptions     Medication Sig Dispense Auth. Provider   doxycycline (VIBRAMYCIN) 100 MG capsule Take 1 capsule (100 mg total) by mouth 2 (two) times daily. 20 capsule Hughie Closs, [place]      PDMP not reviewed this encounter.   Delilah, [place], PA-C 07/04/21 [address] [place] 07/04/21 1412

## 2021-07-07 DIAGNOSIS — S60451A Superficial foreign body of left index finger, initial encounter: Secondary | ICD-10-CM | POA: Diagnosis not present

## 2021-07-31 DIAGNOSIS — U071 COVID-19: Secondary | ICD-10-CM | POA: Diagnosis not present

## 2021-08-26 ENCOUNTER — Encounter: Payer: Medicare Other | Admitting: Adult Health Nurse Practitioner

## 2021-08-31 NOTE — Progress Notes (Signed)
COMPLETE PHYSICAL  Assessment:    Essential hypertension Controlled without medication. Continue DASH diet, exercise and monitor at home. Call if greater than 130/80.  - CBC with Differential - CMP - TSH - Urinalysis, Routine w reflex microscopic - Microalbumin / creatinine urine ratio  Abnormal glucose Discussed general issues about diabetes pathophysiology and management., Educational material distributed., Suggested low cholesterol diet., Encouraged aerobic exercise., Discussed foot care., Reminded to get yearly retinal exam.   Hyperlipidemia check lipids decrease fatty foods increase activity.  - Lipid panel    Vitamin D deficiency Continue supplementation to maintain goal of 70-100 Taking Vitamin D 5,000 IU daily Defer vitamin D level today  History of Breast cancer Continue yearly MGM  Scheduled 09/2020  Osteopenia 2021, Repeat 2-3 years, -1.8, continue Ca, Vitamin D, weight bearing exercises.   CKD (chronic kidney disease) stage 2, GFR 60-89 ml/min Increase fluids  Avoid NSAIDS Blood pressure control Monitor sugars  Will continue to monitor   Medication Management Continued   Further disposition pending results if labs check today. Discussed med's effects and SE's.   Over 45 minutes of face to face interview, exam, counseling, chart review, and critical decision making was performed.     Future Appointments  Date Time Provider [place]  09/02/2022  9:00 AM Demetra Shiner, Townsend Roger, NP [facility] None      Subjective:  Christina Rowe is a 76 y.o. female who presents for complete physical.    She teaches at [employer], history. Reports no purposeful exercise but she walk around campus and between classrooms with tow large bags of books. She reports overall she is doing well and does not have any health or medication concerns today.  Reports she only want to come once a year as she feels like she is in good health.  She is in a study through [place] called "all of [place]" research. They have been tracking her sleeping, steps, genetic testing for predisposition of health conditions.   Her blood pressure has been controlled at home, today their BP is BP: 128/78  BP Readings from Last 3 Encounters:  09/01/21 128/78  07/04/21 (!) 166/90  08/26/20 110/70    She does workout, walks to work, and does Haematologist. She denies chest pain, shortness of breath, dizziness.   BMI is Body mass index is 25.26 kg/m., she is working on diet and exercise. Wt Readings from Last 3 Encounters:  09/01/21 130 lb 6.4 oz (59.1 kg)  08/26/20 134 lb 3.2 oz (60.9 kg)  03/04/20 131 lb (59.4 kg)   She is on aspirin every day.  She is on cholesterol medication, lipitor 40 mg every other day, and denies myalgias. Her cholesterol is at goal. The cholesterol last visit was:   Lab Results  Component Value Date   CHOL 171 08/26/2020   HDL 27 (L) 08/26/2020   LDLCALC 104 (H) 08/26/2020   TRIG 299 (H) 08/26/2020   CHOLHDL 6.3 (H) 08/26/2020    Last A1C in the office was:  Lab Results  Component Value Date   HGBA1C 5.6 01/07/2017   Patient is on Vitamin D supplement.    Lab Results  Component Value Date   VD25OH 12 08/27/2019     She had a right breast cancer s/p lumpectomy in 1996, had 19 lymphnodes removed, MGM 09/2020- done at [facility] She has history of osteopenia, last DEXA 2021, T -1.8 at [place]. Follows with skin surgery center.     Names of  Other Physician/Practitioners you currently use: 1. [facility] here for primary care 2. Dr. Gershon Crane, eye doctor, last visit 01/2021 3. Dr. Viviann Spare on [place], dentist, last visit q 6 months Patient Care Team: Unk Pinto, MD as PCP - General (Internal Medicine) Clarene Essex, MD as Consulting Physician (Gastroenterology) Jovita Kussmaul, MD as Consulting Physician (General Surgery) Ardath Sax, MD as Referring Physician (Obstetrics and  Gynecology)   Medication Review: Current Outpatient Medications on File Prior to Visit  Medication Sig Dispense Refill   aspirin 81 MG tablet Take 81 mg by mouth daily.     atorvastatin (LIPITOR) 40 MG tablet TAKE 1 TABLET EVERY OTHER DAY FOR CHOLESTEROL 46 tablet 1   VITAMIN D, CHOLECALCIFEROL, PO Take 5,000 mg by mouth daily.     doxycycline (VIBRAMYCIN) 100 MG capsule Take 1 capsule (100 mg total) by mouth 2 (two) times daily. (Patient not taking: Reported on 09/01/2021) 20 capsule 0   Omega-3 Fatty Acids (FISH OIL) 1200 MG CAPS Take by mouth. (Patient not taking: Reported on 09/01/2021)     No current facility-administered medications on file prior to visit.    Current Problems (verified) Patient Active Problem List   Diagnosis Date Noted   Malignant neoplasm of right female breast, unspecified estrogen receptor status, unspecified site of breast ([place]) 08/26/2020   Encounter for Medicare annual wellness exam 04/10/2015   Osteopenia 04/09/2014   CKD (chronic kidney disease) stage 2, GFR 60-89 ml/min 04/09/2014   Medication management 08/30/2013   Prediabetes    Hyperlipidemia    Elevated blood pressure reading without diagnosis of hypertension    Vitamin D deficiency    History of breast cancer 06/07/2011    Screening Tests Immunization History  Administered Date(s) Administered   Fluad Quad(high Dose 65+) 02/07/2021   Influenza, High Dose Seasonal PF 03/14/2018   Influenza-Unspecified 02/27/2016, 03/23/2017, 02/11/2020   PFIZER(Purple Top)SARS-COV-2 Vaccination 07/18/2019, 08/07/2019   Pneumococcal Conjugate-13 04/09/2014   Pneumococcal Polysaccharide-23 08/14/2018   Pneumococcal-Unspecified 07/07/2008   Td 07/08/2007, 07/27/2017   Zoster Recombinat (Shingrix) 08/29/2020, 12/18/2020   Zoster, Live 07/08/2007   Health Maintenance  Topic Date Due   COVID-19 Vaccine (3 - Pfizer risk series) 09/04/2019   TETANUS/TDAP  07/28/2027   Pneumonia  Vaccine 49+ Years old  Completed   INFLUENZA VACCINE  Completed   DEXA SCAN  Completed   Hepatitis C Screening  Completed   Zoster Vaccines- Shingrix  Completed   HPV VACCINES  Aged Out   COLONOSCOPY (Pts 45-36yr Insurance coverage will need to be confirmed)  Discontinued    Preventative care: Last colonoscopy: 2020 with Dr. MWatt Climesnegative- will be PRN Last mammogram: 034/2022 Last pap smear/pelvic exam: 2013, declines another DEXA: 08/2019 at [facility], -1.6, osteopenia - due this year CXR 2009  Allergies Allergies  Allergen Reactions   Ppd [Tuberculin Purified Protein Derivative]    Tylenol [Acetaminophen] Other (See Comments)    Hepatitis/increase LFTs    SURGICAL HISTORY She  has a past surgical history that includes Tonsillectomy and adenoidectomy; Breast lumpectomy (Right, 10/96); and Dilation and curettage of uterus.   FAMILY HISTORY Her family history includes COPD in her mother; Stroke in her father.   SOCIAL HISTORY She  reports that she has never smoked. She has never used smokeless tobacco. She reports that she does not drink alcohol and does not use drugs.  Review of Systems  Constitutional:  Negative for chills, diaphoresis, fever, malaise/fatigue and weight loss.  HENT:  Negative for congestion,  ear discharge, ear pain, hearing loss, nosebleeds, sinus pain, sore throat and tinnitus.   Eyes:  Negative for blurred vision, double vision, photophobia, pain, discharge and redness.  Respiratory:  Negative for cough, hemoptysis, sputum production, shortness of breath, wheezing and stridor.   Cardiovascular:  Negative for chest pain, palpitations, orthopnea, claudication, leg swelling and PND.  Gastrointestinal:  Negative for abdominal pain, blood in stool, constipation, diarrhea, heartburn, melena, nausea and vomiting.  Genitourinary:  Negative for dysuria, flank pain, frequency, hematuria and urgency.  Musculoskeletal:  Negative for back pain, falls, joint pain,  myalgias and neck pain.  Skin:  Negative for itching and rash.  Neurological:  Negative for dizziness, tingling, tremors, sensory change, speech change, focal weakness, seizures, loss of consciousness, weakness and headaches.  Endo/Heme/Allergies:  Negative for environmental allergies and polydipsia. Does not bruise/bleed easily.  Psychiatric/Behavioral:  Negative for depression, hallucinations, memory loss, substance abuse and suicidal ideas. The patient is not nervous/anxious and does not have insomnia.     Objective:     Blood pressure 128/78, pulse 69, temperature 97.7 F (36.5 C), height 5' 0.25" (1.53 m), weight 130 lb 6.4 oz (59.1 kg), SpO2 99 %. Body mass index is 25.26 kg/m.  General appearance: Very pleasant elderly female who is alert and in no apparent distress HEENT: normocephalic, sclerae anicteric, TMs pearly, nares patent, no discharge or erythema, pharynx normal Oral cavity: MMM, no lesions Neck: supple, no lymphadenopathy, no thyromegaly, no masses Heart: RRR, normal S1, S2, no murmurs Lungs: CTA bilaterally, no wheezes, rhonchi, or rales Abdomen: +bs, soft, non tender, non distended, no masses, no hepatomegaly, no splenomegaly Musculoskeletal: nontender, no swelling, no obvious deformity Extremities: no edema, no cyanosis, no clubbing Skin: Warm and dry, no lesions or rashes Pulses: 2+ symmetric, upper and lower extremities, normal cap refill Neurological: alert, oriented x 3, CN2-12 intact, strength normal upper extremities and lower extremities, sensation normal throughout, DTRs 2+ throughout, no cerebellar signs, gait normal Psychiatric: normal affect, behavior normal, pleasant  Breast: deferred Pelvic: deferred EKG: NSR, no ST changes  [facility]  09/01/2021

## 2021-09-01 ENCOUNTER — Ambulatory Visit (INDEPENDENT_AMBULATORY_CARE_PROVIDER_SITE_OTHER): Payer: Medicare Other | Admitting: Nurse Practitioner

## 2021-09-01 ENCOUNTER — Encounter: Payer: Self-pay | Admitting: Nurse Practitioner

## 2021-09-01 ENCOUNTER — Other Ambulatory Visit: Payer: Self-pay

## 2021-09-01 VITALS — BP 128/78 | HR 69 | Temp 97.7°F | Ht 60.25 in | Wt 130.4 lb

## 2021-09-01 DIAGNOSIS — Z136 Encounter for screening for cardiovascular disorders: Secondary | ICD-10-CM

## 2021-09-01 DIAGNOSIS — E559 Vitamin D deficiency, unspecified: Secondary | ICD-10-CM | POA: Diagnosis not present

## 2021-09-01 DIAGNOSIS — N182 Chronic kidney disease, stage 2 (mild): Secondary | ICD-10-CM

## 2021-09-01 DIAGNOSIS — Z0001 Encounter for general adult medical examination with abnormal findings: Secondary | ICD-10-CM

## 2021-09-01 DIAGNOSIS — R7309 Other abnormal glucose: Secondary | ICD-10-CM

## 2021-09-01 DIAGNOSIS — E782 Mixed hyperlipidemia: Secondary | ICD-10-CM | POA: Diagnosis not present

## 2021-09-01 DIAGNOSIS — Z853 Personal history of malignant neoplasm of breast: Secondary | ICD-10-CM

## 2021-09-01 DIAGNOSIS — E785 Hyperlipidemia, unspecified: Secondary | ICD-10-CM

## 2021-09-01 DIAGNOSIS — Z79899 Other long term (current) drug therapy: Secondary | ICD-10-CM

## 2021-09-01 DIAGNOSIS — M8580 Other specified disorders of bone density and structure, unspecified site: Secondary | ICD-10-CM

## 2021-09-01 DIAGNOSIS — I10 Essential (primary) hypertension: Secondary | ICD-10-CM

## 2021-09-01 NOTE — Patient Instructions (Signed)

## 2021-09-02 ENCOUNTER — Other Ambulatory Visit: Payer: Medicare Other

## 2021-09-02 DIAGNOSIS — Z79899 Other long term (current) drug therapy: Secondary | ICD-10-CM | POA: Diagnosis not present

## 2021-09-02 DIAGNOSIS — E785 Hyperlipidemia, unspecified: Secondary | ICD-10-CM | POA: Diagnosis not present

## 2021-09-02 DIAGNOSIS — E559 Vitamin D deficiency, unspecified: Secondary | ICD-10-CM | POA: Diagnosis not present

## 2021-09-02 DIAGNOSIS — I10 Essential (primary) hypertension: Secondary | ICD-10-CM | POA: Diagnosis not present

## 2021-09-02 DIAGNOSIS — N182 Chronic kidney disease, stage 2 (mild): Secondary | ICD-10-CM | POA: Diagnosis not present

## 2021-09-02 DIAGNOSIS — R7309 Other abnormal glucose: Secondary | ICD-10-CM | POA: Diagnosis not present

## 2021-09-03 LAB — CBC WITH DIFFERENTIAL/PLATELET
Absolute Monocytes: 299 cells/uL (ref 200–950)
Basophils Absolute: 48 cells/uL (ref 0–200)
Basophils Relative: 1.1 %
Eosinophils Absolute: 48 cells/uL (ref 15–500)
Eosinophils Relative: 1.1 %
HCT: 47.4 % — ABNORMAL HIGH (ref 35.0–45.0)
Hemoglobin: 15.9 g/dL — ABNORMAL HIGH (ref 11.7–15.5)
Lymphs Abs: 1346 cells/uL (ref 850–3900)
MCH: 31.1 pg (ref 27.0–33.0)
MCHC: 33.5 g/dL (ref 32.0–36.0)
MCV: 92.8 fL (ref 80.0–100.0)
MPV: 11.4 fL (ref 7.5–12.5)
Monocytes Relative: 6.8 %
Neutro Abs: 2658 cells/uL (ref 1500–7800)
Neutrophils Relative %: 60.4 %
Platelets: 236 10*3/uL (ref 140–400)
RBC: 5.11 10*6/uL — ABNORMAL HIGH (ref 3.80–5.10)
RDW: 12.3 % (ref 11.0–15.0)
Total Lymphocyte: 30.6 %
WBC: 4.4 10*3/uL (ref 3.8–10.8)

## 2021-09-03 LAB — URINALYSIS, ROUTINE W REFLEX MICROSCOPIC
Bacteria, UA: NONE SEEN /HPF
Bilirubin Urine: NEGATIVE
Glucose, UA: NEGATIVE
Hgb urine dipstick: NEGATIVE
Hyaline Cast: NONE SEEN /LPF
Ketones, ur: NEGATIVE
Nitrite: NEGATIVE
Protein, ur: NEGATIVE
RBC / HPF: NONE SEEN /HPF (ref 0–2)
Specific Gravity, Urine: 1.006 (ref 1.001–1.035)
Squamous Epithelial / HPF: NONE SEEN /HPF (ref <=5)
pH: 6 (ref 5.0–8.0)

## 2021-09-03 LAB — COMPLETE METABOLIC PANEL WITH GFR
AG Ratio: 1.7 (calc) (ref 1.0–2.5)
ALT: 18 U/L (ref 6–29)
AST: 19 U/L (ref 10–35)
Albumin: 4.5 g/dL (ref 3.6–5.1)
Alkaline phosphatase (APISO): 62 U/L (ref 37–153)
BUN: 12 mg/dL (ref 7–25)
CO2: 28 mmol/L (ref 20–32)
Calcium: 10.2 mg/dL (ref 8.6–10.4)
Chloride: 107 mmol/L (ref 98–110)
Creat: 0.84 mg/dL (ref 0.60–1.00)
Globulin: 2.6 g/dL (calc) (ref 1.9–3.7)
Glucose, Bld: 68 mg/dL (ref 65–99)
Potassium: 4.8 mmol/L (ref 3.5–5.3)
Sodium: 145 mmol/L (ref 135–146)
Total Bilirubin: 0.5 mg/dL (ref 0.2–1.2)
Total Protein: 7.1 g/dL (ref 6.1–8.1)
eGFR: 72 mL/min/{1.73_m2} (ref >=60)

## 2021-09-03 LAB — MAGNESIUM: Magnesium: 2.3 mg/dL (ref 1.5–2.5)

## 2021-09-03 LAB — HEMOGLOBIN A1C
Hgb A1c MFr Bld: 5.8 % of total Hgb — ABNORMAL HIGH (ref <5.7)
Mean Plasma Glucose: 120 mg/dL
eAG (mmol/L): 6.6 mmol/L

## 2021-09-03 LAB — MICROSCOPIC MESSAGE

## 2021-09-03 LAB — LIPID PANEL
Cholesterol: 179 mg/dL (ref <200)
HDL: 32 mg/dL — ABNORMAL LOW (ref >=50)
LDL Cholesterol (Calc): 121 mg/dL (calc) — ABNORMAL HIGH
Non-HDL Cholesterol (Calc): 147 mg/dL (calc) — ABNORMAL HIGH (ref <130)
Total CHOL/HDL Ratio: 5.6 (calc) — ABNORMAL HIGH (ref <5.0)
Triglycerides: 149 mg/dL (ref <150)

## 2021-09-03 LAB — TSH: TSH: 1.79 mIU/L (ref 0.40–4.50)

## 2021-09-03 LAB — MICROALBUMIN / CREATININE URINE RATIO
Creatinine, Urine: 32 mg/dL (ref 20–275)
Microalb Creat Ratio: 13 mcg/mg creat (ref <30)
Microalb, Ur: 0.4 mg/dL

## 2021-09-03 LAB — VITAMIN D 25 HYDROXY (VIT D DEFICIENCY, FRACTURES): Vit D, 25-Hydroxy: 59 ng/mL (ref 30–100)

## 2021-09-28 DIAGNOSIS — U071 COVID-19: Secondary | ICD-10-CM | POA: Diagnosis not present

## 2021-10-08 DIAGNOSIS — Z1231 Encounter for screening mammogram for malignant neoplasm of breast: Secondary | ICD-10-CM | POA: Diagnosis not present

## 2021-10-08 LAB — HM MAMMOGRAPHY

## 2021-10-21 ENCOUNTER — Encounter: Payer: Self-pay | Admitting: Internal Medicine

## 2021-10-22 DIAGNOSIS — Z23 Encounter for immunization: Secondary | ICD-10-CM | POA: Diagnosis not present

## 2021-12-26 ENCOUNTER — Other Ambulatory Visit: Payer: Self-pay | Admitting: Nurse Practitioner

## 2022-01-30 DIAGNOSIS — Z23 Encounter for immunization: Secondary | ICD-10-CM | POA: Diagnosis not present

## 2022-03-19 DIAGNOSIS — Z23 Encounter for immunization: Secondary | ICD-10-CM | POA: Diagnosis not present

## 2022-04-13 DIAGNOSIS — H25013 Cortical age-related cataract, bilateral: Secondary | ICD-10-CM | POA: Diagnosis not present

## 2022-04-13 DIAGNOSIS — H2513 Age-related nuclear cataract, bilateral: Secondary | ICD-10-CM | POA: Diagnosis not present

## 2022-06-23 ENCOUNTER — Other Ambulatory Visit: Payer: Self-pay | Admitting: Nurse Practitioner

## 2022-07-13 DIAGNOSIS — Z86007 Personal history of in-situ neoplasm of skin: Secondary | ICD-10-CM | POA: Diagnosis not present

## 2022-07-13 DIAGNOSIS — L570 Actinic keratosis: Secondary | ICD-10-CM | POA: Diagnosis not present

## 2022-07-13 DIAGNOSIS — L718 Other rosacea: Secondary | ICD-10-CM | POA: Diagnosis not present

## 2022-07-13 DIAGNOSIS — L821 Other seborrheic keratosis: Secondary | ICD-10-CM | POA: Diagnosis not present

## 2022-07-13 DIAGNOSIS — L578 Other skin changes due to chronic exposure to nonionizing radiation: Secondary | ICD-10-CM | POA: Diagnosis not present

## 2022-07-13 DIAGNOSIS — L814 Other melanin hyperpigmentation: Secondary | ICD-10-CM | POA: Diagnosis not present

## 2022-07-13 DIAGNOSIS — D225 Melanocytic nevi of trunk: Secondary | ICD-10-CM | POA: Diagnosis not present

## 2022-07-13 DIAGNOSIS — Z08 Encounter for follow-up examination after completed treatment for malignant neoplasm: Secondary | ICD-10-CM | POA: Diagnosis not present

## 2022-09-01 NOTE — Progress Notes (Unsigned)
COMPLETE PHYSICAL  Assessment:    Essential hypertension Controlled without medication. Continue DASH diet, exercise and monitor at home. Call if greater than 130/80.  - CBC with Differential - CMP - TSH - Urinalysis, Routine w reflex microscopic - Microalbumin / creatinine urine ratio  Abnormal glucose Discussed general issues about diabetes pathophysiology and management., Educational material distributed., Suggested low cholesterol diet., Encouraged aerobic exercise., Discussed foot care., Reminded to get yearly retinal exam.   Hyperlipidemia check lipids decrease fatty foods increase activity.  - Lipid panel    Vitamin D deficiency Continue supplementation to maintain goal of 70-100 Taking Vitamin D 5,000 IU daily Defer vitamin D level today  History of Breast cancer Continue yearly MGM  Scheduled 09/2020  Osteopenia 2021, Repeat 2-3 years, -1.8, continue Ca, Vitamin D, weight bearing exercises.   CKD (chronic kidney disease) stage 2, GFR 60-89 ml/min Increase fluids  Avoid NSAIDS Blood pressure control Monitor sugars  Will continue to monitor   Medication Management Continued  Screening for ischemic heart disease - EKG  Screening for hematuria and proteinuria - Routine UA with reflex microscopic - Micorablumin/creatinine urine ratio  Screening for thyroid - TSH   Further disposition pending results if labs check today. Discussed med's effects and SE's.   Over 45 minutes of face to face interview, exam, counseling, chart review, and critical decision making was performed.     Future Appointments  Date Time Provider [place]  09/08/2023  9:00 AM Alycia Rossetti, NP [facility] None      Subjective:  Christina Rowe is a 77 y.o. female who presents for complete physical.    She teaches at [employer], history. Teaches four classes. Reports no purposeful exercise but she walk around campus and between classrooms with tow large bags of books. She  reports overall she is doing well and does not have any health or medication concerns today.  Reports she only want to come once a year as she feels like she is in good health.  She is in a study through [place] called "all of [place]" research. They have been tracking her sleeping, steps, genetic testing for predisposition of health conditions.   Her blood pressure has been controlled at home, today their BP is BP: 136/72  BP Readings from Last 3 Encounters:  09/02/22 136/72  09/01/21 128/78  07/04/21 (!) 166/90    She does workout, walks to work, and does Haematologist. She denies chest pain, shortness of breath, dizziness.   BMI is Body mass index is 26.13 kg/m., she is working on diet and exercise. Wt Readings from Last 3 Encounters:  09/02/22 133 lb 12.8 oz (60.7 kg)  09/01/21 130 lb 6.4 oz (59.1 kg)  08/26/20 134 lb 3.2 oz (60.9 kg)   She is on aspirin every day.  She is on cholesterol medication, lipitor 40 mg every other day, and denies myalgias. Her cholesterol is at goal. The cholesterol last visit was:   Lab Results  Component Value Date   CHOL 179 09/02/2021   HDL 32 (L) 09/02/2021   LDLCALC 121 (H) 09/02/2021   TRIG 149 09/02/2021   CHOLHDL 5.6 (H) 09/02/2021    Last A1C in the office was:  Lab Results  Component Value Date   HGBA1C 5.8 (H) 09/02/2021   Patient is on Vitamin D supplement.    Lab Results  Component Value Date   VD25OH 34 09/02/2021     She had a right breast cancer s/p  lumpectomy in 1996, had 19 lymphnodes removed, MGM 09/2020- done at [facility] She has history of osteopenia, last DEXA 2021, T -1.8 at [place]. Follows with skin surgery center.     Names of Other Physician/Practitioners you currently use: 1. [facility] here for primary care 2. Dr. Gershon Crane, eye doctor, last visit 03/2022 3. Dr. Viviann Spare on [place], dentist, last visit 07/2022 Patient Care Team: Unk Pinto, MD as PCP - General  (Internal Medicine) Clarene Essex, MD as Consulting Physician (Gastroenterology) Jovita Kussmaul, MD as Consulting Physician (General Surgery) Ardath Sax, MD as Referring Physician (Obstetrics and Gynecology)   Medication Review: Current Outpatient Medications on File Prior to Visit  Medication Sig Dispense Refill   aspirin 81 MG tablet Take 81 mg by mouth daily.  Every other day     atorvastatin (LIPITOR) 40 MG tablet TAKE 1 TABLET EVERY OTHER DAY FOR CHOLESTEROL 46 tablet 1   VITAMIN D, CHOLECALCIFEROL, PO Take 5,000 mg by mouth daily. (Patient not taking: Reported on 09/02/2022)     No current facility-administered medications on file prior to visit.    Current Problems (verified) Patient Active Problem List   Diagnosis Date Noted   Malignant neoplasm of right female breast, unspecified estrogen receptor status, unspecified site of breast ([place]) 08/26/2020   Encounter for Medicare annual wellness exam 04/10/2015   Osteopenia 04/09/2014   CKD (chronic kidney disease) stage 2, GFR 60-89 ml/min 04/09/2014   Medication management 08/30/2013   Prediabetes    Hyperlipidemia    Elevated blood pressure reading without diagnosis of hypertension    Vitamin D deficiency    History of breast cancer 06/07/2011    Screening Tests Immunization History  Administered Date(s) Administered   Fluad Quad(high Dose 65+) 02/07/2021   Influenza, High Dose Seasonal PF 03/14/2018   Influenza-Unspecified 02/27/2016, 03/23/2017, 02/11/2020   PFIZER(Purple Top)SARS-COV-2 Vaccination 07/18/2019, 08/07/2019   Pneumococcal Conjugate-13 04/09/2014   Pneumococcal Polysaccharide-23 08/14/2018   Pneumococcal-Unspecified 07/07/2008   Td 07/08/2007, 07/27/2017   Zoster Recombinat (Shingrix) 08/29/2020, 12/18/2020   Zoster, Live 07/08/2007   Health Maintenance  Topic Date Due   COVID-19 Vaccine (3 - Pfizer risk series) 09/04/2019   Medicare Annual Wellness (AWV)  03/04/2021   INFLUENZA VACCINE   01/26/2022   DTaP/Tdap/Td (3 - Tdap) 07/28/2027   Pneumonia Vaccine 36+ Years old  Completed   DEXA SCAN  Completed   Hepatitis C Screening  Completed   Zoster Vaccines- Shingrix  Completed   HPV VACCINES  Aged Out   COLONOSCOPY (Pts 45-49yr Insurance coverage will need to be confirmed)  Discontinued    Preventative care: Last colonoscopy: 2020 with Dr. MWatt Climesnegative- will be PRN Last mammogram: 08/29/2020 scheduled for 08/2022 Last pap smear/pelvic exam: 2013, declines another DEXA: 08/2019 at [facility], -1.6, osteopenia - due this year but does not want to pursue treatment CXR 2009  Allergies Allergies  Allergen Reactions   Ppd [Tuberculin Purified Protein Derivative]    Tylenol [Acetaminophen] Other (See Comments)    Hepatitis/increase LFTs    SURGICAL HISTORY She  has a past surgical history that includes Tonsillectomy and adenoidectomy; Breast lumpectomy (Right, 10/96); and Dilation and curettage of uterus.   FAMILY HISTORY Her family history includes COPD in her mother; Stroke in her father.   SOCIAL HISTORY She  reports that she has never smoked. She has never used smokeless tobacco. She reports that she does not drink alcohol and does not use drugs.  Review of Systems  Constitutional:  Negative  for chills and fever.  HENT:  Negative for congestion, hearing loss, sinus pain, sore throat and tinnitus.   Eyes:  Negative for blurred vision and double vision.  Respiratory:  Negative for cough, hemoptysis, sputum production, shortness of breath and wheezing.   Cardiovascular:  Negative for chest pain, palpitations and leg swelling.  Gastrointestinal:  Negative for abdominal pain, constipation, diarrhea, heartburn, nausea and vomiting.  Genitourinary:  Negative for dysuria and urgency.  Musculoskeletal:  Negative for back pain, falls, joint pain, myalgias and neck pain.  Skin:  Negative for rash.  Neurological:  Negative for dizziness, tingling, tremors, weakness and  headaches.  Endo/Heme/Allergies:  Does not bruise/bleed easily.  Psychiatric/Behavioral:  Negative for depression and suicidal ideas. The patient is not nervous/anxious and does not have insomnia.      Objective:     Blood pressure 136/72, pulse 61, temperature (!) 97.3 F (36.3 C), height 5' (1.524 m), weight 133 lb 12.8 oz (60.7 kg), SpO2 98 %. Body mass index is 26.13 kg/m.  General appearance: Very pleasant elderly female who is alert and in no apparent distress HEENT: normocephalic, sclerae anicteric, TMs pearly, nares patent, no discharge or erythema, pharynx normal Oral cavity: MMM, no lesions Neck: supple, no lymphadenopathy, no thyromegaly, no masses Heart: RRR, normal S1, S2, no murmurs Lungs: CTA bilaterally, no wheezes, rhonchi, or rales Abdomen: +bs, soft, non tender, non distended, no masses, no hepatomegaly, no splenomegaly Musculoskeletal: nontender, no swelling, no obvious deformity Extremities: no edema, no cyanosis, no clubbing Skin: Warm and dry, no lesions or rashes Pulses: 2+ symmetric, upper and lower extremities, normal cap refill Neurological: alert, oriented x 3, CN2-12 intact, strength normal upper extremities and lower extremities, sensation normal throughout, DTRs 2+ throughout, no cerebellar signs, gait normal Psychiatric: normal affect, behavior normal, pleasant  Breast: deferred Pelvic: deferred EKG: Mild sinus bradycardia, no ST changes  [facility]  09/02/2022

## 2022-09-02 ENCOUNTER — Ambulatory Visit (INDEPENDENT_AMBULATORY_CARE_PROVIDER_SITE_OTHER): Payer: Medicare Other | Admitting: Nurse Practitioner

## 2022-09-02 ENCOUNTER — Encounter: Payer: Self-pay | Admitting: Nurse Practitioner

## 2022-09-02 VITALS — BP 136/72 | HR 61 | Temp 97.3°F | Ht 60.0 in | Wt 133.8 lb

## 2022-09-02 DIAGNOSIS — Z1389 Encounter for screening for other disorder: Secondary | ICD-10-CM | POA: Diagnosis not present

## 2022-09-02 DIAGNOSIS — Z79899 Other long term (current) drug therapy: Secondary | ICD-10-CM | POA: Diagnosis not present

## 2022-09-02 DIAGNOSIS — Z136 Encounter for screening for cardiovascular disorders: Secondary | ICD-10-CM

## 2022-09-02 DIAGNOSIS — Z1329 Encounter for screening for other suspected endocrine disorder: Secondary | ICD-10-CM

## 2022-09-02 DIAGNOSIS — I10 Essential (primary) hypertension: Secondary | ICD-10-CM | POA: Diagnosis not present

## 2022-09-02 DIAGNOSIS — R7309 Other abnormal glucose: Secondary | ICD-10-CM

## 2022-09-02 DIAGNOSIS — E782 Mixed hyperlipidemia: Secondary | ICD-10-CM | POA: Diagnosis not present

## 2022-09-02 DIAGNOSIS — Z0001 Encounter for general adult medical examination with abnormal findings: Secondary | ICD-10-CM

## 2022-09-02 DIAGNOSIS — N182 Chronic kidney disease, stage 2 (mild): Secondary | ICD-10-CM

## 2022-09-02 DIAGNOSIS — Z0000 Encounter for general adult medical examination without abnormal findings: Secondary | ICD-10-CM

## 2022-09-02 DIAGNOSIS — M8580 Other specified disorders of bone density and structure, unspecified site: Secondary | ICD-10-CM

## 2022-09-02 DIAGNOSIS — Z853 Personal history of malignant neoplasm of breast: Secondary | ICD-10-CM

## 2022-09-02 DIAGNOSIS — E559 Vitamin D deficiency, unspecified: Secondary | ICD-10-CM

## 2022-09-02 NOTE — Patient Instructions (Signed)

## 2022-09-03 LAB — URINALYSIS, ROUTINE W REFLEX MICROSCOPIC
Bacteria, UA: NONE SEEN /HPF
Bilirubin Urine: NEGATIVE
Glucose, UA: NEGATIVE
Hgb urine dipstick: NEGATIVE
Hyaline Cast: NONE SEEN /LPF
Ketones, ur: NEGATIVE
Nitrite: NEGATIVE
Protein, ur: NEGATIVE
RBC / HPF: NONE SEEN /HPF (ref 0–2)
Specific Gravity, Urine: 1.014 (ref 1.001–1.035)
pH: <=5 (ref 5.0–8.0)

## 2022-09-03 LAB — CBC WITH DIFFERENTIAL/PLATELET
Absolute Monocytes: 382 cells/uL (ref 200–950)
Basophils Absolute: 39 cells/uL (ref 0–200)
Basophils Relative: 0.8 %
Eosinophils Absolute: 69 cells/uL (ref 15–500)
Eosinophils Relative: 1.4 %
HCT: 47.3 % — ABNORMAL HIGH (ref 35.0–45.0)
Hemoglobin: 16.2 g/dL — ABNORMAL HIGH (ref 11.7–15.5)
Lymphs Abs: 1250 cells/uL (ref 850–3900)
MCH: 31.6 pg (ref 27.0–33.0)
MCHC: 34.2 g/dL (ref 32.0–36.0)
MCV: 92.4 fL (ref 80.0–100.0)
MPV: 10.8 fL (ref 7.5–12.5)
Monocytes Relative: 7.8 %
Neutro Abs: 3161 cells/uL (ref 1500–7800)
Neutrophils Relative %: 64.5 %
Platelets: 211 10*3/uL (ref 140–400)
RBC: 5.12 10*6/uL — ABNORMAL HIGH (ref 3.80–5.10)
RDW: 12.9 % (ref 11.0–15.0)
Total Lymphocyte: 25.5 %
WBC: 4.9 10*3/uL (ref 3.8–10.8)

## 2022-09-03 LAB — LIPID PANEL
Cholesterol: 191 mg/dL (ref <200)
HDL: 34 mg/dL — ABNORMAL LOW (ref >=50)
LDL Cholesterol (Calc): 127 mg/dL (calc) — ABNORMAL HIGH
Non-HDL Cholesterol (Calc): 157 mg/dL (calc) — ABNORMAL HIGH (ref <130)
Total CHOL/HDL Ratio: 5.6 (calc) — ABNORMAL HIGH (ref <5.0)
Triglycerides: 183 mg/dL — ABNORMAL HIGH (ref <150)

## 2022-09-03 LAB — COMPLETE METABOLIC PANEL WITH GFR
AG Ratio: 1.6 (calc) (ref 1.0–2.5)
ALT: 21 U/L (ref 6–29)
AST: 19 U/L (ref 10–35)
Albumin: 4.5 g/dL (ref 3.6–5.1)
Alkaline phosphatase (APISO): 62 U/L (ref 37–153)
BUN: 17 mg/dL (ref 7–25)
CO2: 29 mmol/L (ref 20–32)
Calcium: 10 mg/dL (ref 8.6–10.4)
Chloride: 105 mmol/L (ref 98–110)
Creat: 0.75 mg/dL (ref 0.60–1.00)
Globulin: 2.8 g/dL (calc) (ref 1.9–3.7)
Glucose, Bld: 88 mg/dL (ref 65–99)
Potassium: 4.5 mmol/L (ref 3.5–5.3)
Sodium: 143 mmol/L (ref 135–146)
Total Bilirubin: 0.5 mg/dL (ref 0.2–1.2)
Total Protein: 7.3 g/dL (ref 6.1–8.1)
eGFR: 81 mL/min/{1.73_m2} (ref >=60)

## 2022-09-03 LAB — VITAMIN D 25 HYDROXY (VIT D DEFICIENCY, FRACTURES): Vit D, 25-Hydroxy: 44 ng/mL (ref 30–100)

## 2022-09-03 LAB — HEMOGLOBIN A1C
Hgb A1c MFr Bld: 5.9 % of total Hgb — ABNORMAL HIGH (ref <5.7)
Mean Plasma Glucose: 123 mg/dL
eAG (mmol/L): 6.8 mmol/L

## 2022-09-03 LAB — MICROALBUMIN / CREATININE URINE RATIO
Creatinine, Urine: 78 mg/dL (ref 20–275)
Microalb Creat Ratio: 8 mcg/mg creat (ref <30)
Microalb, Ur: 0.6 mg/dL

## 2022-09-03 LAB — MICROSCOPIC MESSAGE

## 2022-09-03 LAB — TSH: TSH: 2.57 mIU/L (ref 0.40–4.50)

## 2022-09-03 LAB — MAGNESIUM: Magnesium: 2.3 mg/dL (ref 1.5–2.5)

## 2022-09-17 DIAGNOSIS — Z23 Encounter for immunization: Secondary | ICD-10-CM | POA: Diagnosis not present

## 2022-09-30 DIAGNOSIS — H5203 Hypermetropia, bilateral: Secondary | ICD-10-CM | POA: Diagnosis not present

## 2022-09-30 DIAGNOSIS — H2513 Age-related nuclear cataract, bilateral: Secondary | ICD-10-CM | POA: Diagnosis not present

## 2022-10-12 ENCOUNTER — Encounter: Payer: Self-pay | Admitting: Internal Medicine

## 2022-10-12 DIAGNOSIS — Z1231 Encounter for screening mammogram for malignant neoplasm of breast: Secondary | ICD-10-CM | POA: Diagnosis not present

## 2022-10-12 LAB — HM MAMMOGRAPHY

## 2022-11-24 DIAGNOSIS — H25812 Combined forms of age-related cataract, left eye: Secondary | ICD-10-CM | POA: Diagnosis not present

## 2022-11-24 DIAGNOSIS — H2512 Age-related nuclear cataract, left eye: Secondary | ICD-10-CM | POA: Diagnosis not present

## 2022-12-08 DIAGNOSIS — H25811 Combined forms of age-related cataract, right eye: Secondary | ICD-10-CM | POA: Diagnosis not present

## 2022-12-08 DIAGNOSIS — H2511 Age-related nuclear cataract, right eye: Secondary | ICD-10-CM | POA: Diagnosis not present

## 2022-12-13 ENCOUNTER — Other Ambulatory Visit: Payer: Self-pay | Admitting: Nurse Practitioner

## 2023-02-10 DIAGNOSIS — Z23 Encounter for immunization: Secondary | ICD-10-CM | POA: Diagnosis not present

## 2023-03-07 DIAGNOSIS — Z23 Encounter for immunization: Secondary | ICD-10-CM | POA: Diagnosis not present

## 2023-03-28 ENCOUNTER — Encounter: Payer: Self-pay | Admitting: Internal Medicine

## 2023-03-28 DIAGNOSIS — R0989 Other specified symptoms and signs involving the circulatory and respiratory systems: Secondary | ICD-10-CM | POA: Insufficient documentation

## 2023-03-28 NOTE — Progress Notes (Unsigned)
Future Appointments  Date Time Provider Department  03/31/2023  9:30 AM Lucky Cowboy, MD [facility]                            overdue wellness     09/08/2023                    cpe  9:00 AM Raynelle Dick, NP [facility]    History of Present Illness:       [name]  presents for 3 month follow up with HTN, HLD, Pre-Diabetes and Vitamin D Deficiency.         Patient is followed expectantly for labile HTN & BP has been controlled at home. Today's BP is at goal - 138/78. Patient has had no complaints of any cardiac type chest pain, palpitations, dyspnea Christina Rowe /PND, dizziness, claudication or dependent edema.        Hyperlipidemia is not controlled with diet & Atorvastatin . Patient denies myalgias or other med SE's. Last Lipids were not at goal :  Lab Results  Component Value Date   CHOL 191 09/02/2022   HDL 34 (L) 09/02/2022   LDLCALC 127 (H) 09/02/2022   TRIG 183 (H) 09/02/2022   CHOLHDL 5.6 (H) 09/02/2022     Also, the patient has history of PreDiabetes (A1c 5.8% in Oct 2016) and has had no symptoms of reactive hypoglycemia, diabetic polys, paresthesias or visual blurring.  Last A1c was near goal :  Lab Results  Component Value Date   HGBA1C 5.9 (H) 09/02/2022                                                      Further, the patient also has history of Vitamin D Deficiency ("38" on supplements in 2014) and  does not supplement  vitamin D  as recommended . Last vitamin D was still low :  Lab Results  Component Value Date   VD25OH 44 09/02/2022     Current Outpatient Medications on File Prior to Visit  Medication Sig   aspirin 81 MG tablet Take   Every other day   atorvastatin  40 MG tablet TAKE 1 TABLET EVERY OTHER DAY      Allergies  Allergen Reactions   Ppd [Tuberculin Purified Protein Derivative]    Tylenol [Acetaminophen] Other (See Comments)    Hepatitis/increase LFTs      PMHx:   Past Medical History:  Diagnosis  Date   Cancer (HCC)    breast   Hyperlipemia 05/08/2013   Hyperlipidemia    Hypertension    Hypomagnesemia 05/08/2013   Osteopenia    Prediabetes    Vitamin D deficiency      Immunization History  Administered Date(s) Administered   Fluad Quad(high Dose) 02/07/2021   Influenza, High Dose  03/14/2018, 02/18/2022   Influenza 02/27/2016, 03/23/2017, 02/11/2020   Moderna Sars-Covid-2 Vacc 09/17/2022   PFIZER SARS-COV-2 Vacc 07/18/2019, 08/07/2019   Pneumococcal -13 04/09/2014   Pneumococcal -23 08/14/2018   Pneumococcal-23 07/07/2008   Td 07/08/2007, 07/27/2017   Zoster Recombinant(Shingrix) 08/29/2020, 12/18/2020   Zoster, Live 07/08/2007     Past Surgical History:  Procedure Laterality Date   BREAST LUMPECTOMY Right 10/96   right lumpectomy and axlnd   DILATION AND CURETTAGE  OF UTERUS     X 2 while on Tamoxifen   TONSILLECTOMY AND ADENOIDECTOMY       FHx:    Reviewed / unchanged   SHx:    Reviewed / unchanged    Systems Review:  Constitutional: Denies fever, chills, wt changes, headaches, insomnia, fatigue, night sweats, change in appetite. Eyes: Denies redness, blurred vision, diplopia, discharge, itchy, watery eyes.  ENT: Denies discharge, congestion, post nasal drip, epistaxis, sore throat, earache, hearing loss, dental pain, tinnitus, vertigo, sinus pain, snoring.  CV: Denies chest pain, palpitations, irregular heartbeat, syncope, dyspnea, diaphoresis, orthopnea, PND, claudication or edema. Respiratory: denies cough, dyspnea, DOE, pleurisy, hoarseness, laryngitis, wheezing.  Gastrointestinal: Denies dysphagia, odynophagia, heartburn, reflux, water brash, abdominal pain or cramps, nausea, vomiting, bloating, diarrhea, constipation, hematemesis, melena, hematochezia  or hemorrhoids. Genitourinary: Denies dysuria, frequency, urgency, nocturia, hesitancy, discharge, hematuria or flank pain. Musculoskeletal: Denies arthralgias, myalgias, stiffness, jt. swelling,  pain, limping or strain/sprain.  Skin: Denies pruritus, rash, hives, warts, acne, eczema or change in skin lesion(s). Neuro: No weakness, tremor, incoordination, spasms, paresthesia or pain. Psychiatric: Denies confusion, memory loss or sensory loss. Endo: Denies change in weight, skin or hair change.  Heme/Lymph: No excessive bleeding, bruising or enlarged lymph nodes.   Physical Exam  BP 138/78   Pulse 74   Temp 97.9 F (36.6 C)   Resp 17   Ht 5' (1.524 m)   Wt 133 lb 3.2 oz (60.4 kg)   SpO2 97%   BMI 26.01 kg/m   Appears  well nourished, well groomed  and in no distress.  Eyes: PERRLA, EOMs, conjunctiva no swelling or erythema. Sinuses: No frontal/maxillary tenderness ENT/Mouth: EAC's clear, TM's nl w/o erythema, bulging. Nares clear w/o erythema, swelling, exudates. Oropharynx clear without erythema or exudates. Oral hygiene is good. Tongue normal, non obstructing. Hearing intact.  Neck: Supple. Thyroid not palpable. Car 2+/2+ without bruits, nodes or JVD. Chest: Respirations nl with BS clear & equal w/o rales, rhonchi, wheezing or stridor.  Cor: Heart sounds normal w/ regular rate and rhythm without sig. murmurs, gallops, clicks or rubs. Peripheral pulses normal and equal  without edema.  Abdomen: Soft & bowel sounds normal. Non-tender w/o guarding, rebound, hernias, masses or organomegaly.  Lymphatics: Unremarkable.  Musculoskeletal: Full ROM all peripheral extremities, joint stability, 5/5 strength and normal gait.  Skin: Warm, dry without exposed rashes, lesions or ecchymosis apparent.  Neuro: Cranial nerves intact, reflexes equal bilaterally. Sensory-motor testing grossly intact. Tendon reflexes grossly intact.  Pysch: Alert & oriented x 3.  Insight and judgement nl & appropriate. No ideations.   Assessment and Plan:   1. Labile hypertension  - Continue medication, monitor blood pressure at home.  - Continue DASH diet.  Reminder to go to the ER if any CP,  SOB,  nausea, dizziness, severe HA, changes vision/speech.   - CBC with Differential/Platelet - COMPLETE METABOLIC PANEL WITH GFR - Magnesium - TSH   2. Hyperlipidemia, mixed  - Continue diet/meds, exercise,& lifestyle modifications.  - Continue monitor periodic cholesterol/liver & renal functions    - Lipid panel - TSH   3. Abnormal glucose  - Continue diet, exercise  - Lifestyle modifications.  - Monitor appropriate labs   - Hemoglobin A1c - Insulin, random   4. Vitamin D deficiency  - Continue supplementation - VITAMIN D 25 Hydroxy   5. Medication management  - CBC with Differential/Platelet - COMPLETE METABOLIC PANEL WITH GFR - Magnesium - Lipid panel - TSH - Hemoglobin A1c - Insulin, random -  VITAMIN D 25 Hydroxy          Discussed  regular exercise, BP monitoring, weight control to achieve/maintain BMI less than 25 and discussed med and SE's. Recommended labs to assess /monitor clinical status .  I discussed the assessment and treatment plan with the patient. The patient was provided an opportunity to ask questions and all were answered. The patient agreed with the plan and demonstrated an understanding of the instructions.  I provided over 30 minutes of exam, counseling, chart review and  complex critical decision making.        The patient was advised to call back or seek an in-person evaluation if the symptoms worsen or if the condition fails to improve as anticipated.   Marinus Maw, MD

## 2023-03-28 NOTE — Patient Instructions (Signed)

## 2023-03-31 ENCOUNTER — Encounter: Payer: Self-pay | Admitting: Internal Medicine

## 2023-03-31 ENCOUNTER — Ambulatory Visit (INDEPENDENT_AMBULATORY_CARE_PROVIDER_SITE_OTHER): Payer: Medicare Other | Admitting: Internal Medicine

## 2023-03-31 VITALS — BP 138/78 | HR 74 | Temp 97.9°F | Resp 17 | Ht 60.0 in | Wt 133.2 lb

## 2023-03-31 DIAGNOSIS — R0989 Other specified symptoms and signs involving the circulatory and respiratory systems: Secondary | ICD-10-CM | POA: Diagnosis not present

## 2023-03-31 DIAGNOSIS — R7309 Other abnormal glucose: Secondary | ICD-10-CM | POA: Diagnosis not present

## 2023-03-31 DIAGNOSIS — E782 Mixed hyperlipidemia: Secondary | ICD-10-CM | POA: Diagnosis not present

## 2023-03-31 DIAGNOSIS — E559 Vitamin D deficiency, unspecified: Secondary | ICD-10-CM

## 2023-03-31 DIAGNOSIS — Z79899 Other long term (current) drug therapy: Secondary | ICD-10-CM

## 2023-04-01 LAB — COMPLETE METABOLIC PANEL WITH GFR
AG Ratio: 1.8 (calc) (ref 1.0–2.5)
ALT: 18 U/L (ref 6–29)
AST: 16 U/L (ref 10–35)
Albumin: 4.6 g/dL (ref 3.6–5.1)
Alkaline phosphatase (APISO): 58 U/L (ref 37–153)
BUN: 14 mg/dL (ref 7–25)
CO2: 30 mmol/L (ref 20–32)
Calcium: 9.9 mg/dL (ref 8.6–10.4)
Chloride: 108 mmol/L (ref 98–110)
Creat: 0.75 mg/dL (ref 0.60–1.00)
Globulin: 2.5 g/dL (ref 1.9–3.7)
Glucose, Bld: 81 mg/dL (ref 65–99)
Potassium: 4.7 mmol/L (ref 3.5–5.3)
Sodium: 144 mmol/L (ref 135–146)
Total Bilirubin: 0.5 mg/dL (ref 0.2–1.2)
Total Protein: 7.1 g/dL (ref 6.1–8.1)
eGFR: 81 mL/min/{1.73_m2} (ref >=60)

## 2023-04-01 LAB — MAGNESIUM: Magnesium: 2.3 mg/dL (ref 1.5–2.5)

## 2023-04-01 LAB — CBC WITH DIFFERENTIAL/PLATELET
Absolute Monocytes: 382 {cells}/uL (ref 200–950)
Basophils Absolute: 29 {cells}/uL (ref 0–200)
Basophils Relative: 0.6 %
Eosinophils Absolute: 39 {cells}/uL (ref 15–500)
Eosinophils Relative: 0.8 %
HCT: 49 % — ABNORMAL HIGH (ref 35.0–45.0)
Hemoglobin: 15.7 g/dL — ABNORMAL HIGH (ref 11.7–15.5)
Lymphs Abs: 1642 {cells}/uL (ref 850–3900)
MCH: 30.9 pg (ref 27.0–33.0)
MCHC: 32 g/dL (ref 32.0–36.0)
MCV: 96.5 fL (ref 80.0–100.0)
MPV: 11.5 fL (ref 7.5–12.5)
Monocytes Relative: 7.8 %
Neutro Abs: 2808 {cells}/uL (ref 1500–7800)
Neutrophils Relative %: 57.3 %
Platelets: 225 10*3/uL (ref 140–400)
RBC: 5.08 10*6/uL (ref 3.80–5.10)
RDW: 12.4 % (ref 11.0–15.0)
Total Lymphocyte: 33.5 %
WBC: 4.9 10*3/uL (ref 3.8–10.8)

## 2023-04-01 LAB — LIPID PANEL
Cholesterol: 164 mg/dL (ref <200)
HDL: 29 mg/dL — ABNORMAL LOW (ref >=50)
LDL Cholesterol (Calc): 105 mg/dL — ABNORMAL HIGH
Non-HDL Cholesterol (Calc): 135 mg/dL — ABNORMAL HIGH (ref <130)
Total CHOL/HDL Ratio: 5.7 (calc) — ABNORMAL HIGH (ref <5.0)
Triglycerides: 189 mg/dL — ABNORMAL HIGH (ref <150)

## 2023-04-01 LAB — HEMOGLOBIN A1C
Hgb A1c MFr Bld: 6 %{Hb} — ABNORMAL HIGH (ref <5.7)
Mean Plasma Glucose: 126 mg/dL
eAG (mmol/L): 7 mmol/L

## 2023-04-01 LAB — VITAMIN D 25 HYDROXY (VIT D DEFICIENCY, FRACTURES): Vit D, 25-Hydroxy: 36 ng/mL (ref 30–100)

## 2023-04-01 LAB — INSULIN, RANDOM: Insulin: 15.3 u[IU]/mL

## 2023-04-01 LAB — TSH: TSH: 1.29 m[IU]/L (ref 0.40–4.50)

## 2023-04-02 NOTE — Progress Notes (Signed)
<>*<>*<>*<>*<>*<>*<>*<>*<>*<>*<>*<>*<>*<>*<>*<>*<>*<>*<>*<>*<>*<>*<>*<>*<> <>*<>*<>*<>*<>*<>*<>*<>*<>*<>*<>*<>*<>*<>*<>*<>*<>*<>*<>*<>*<>*<>*<>*<>*<>  -  Test results slightly outside the reference range are not unusual. If there is anything important, I will review this with you,  otherwise it is considered normal test values.  If you have further questions,  please do not hesitate to contact me at the office or via My Chart.   <>*<>*<>*<>*<>*<>*<>*<>*<>*<>*<>*<>*<>*<>*<>*<>*<>*<>*<>*<>*<>*<>*<>*<>*<> <>*<>*<>*<>*<>*<>*<>*<>*<>*<>*<>*<>*<>*<>*<>*<>*<>*<>*<>*<>*<>*<>*<>*<>*<>  -  Chol = 164   Excellent   - Very low risk for Heart Attack  / Stroke  ^>^>^>^>^>^>^>^>^>^>^>^>^>^>^>^>^>^>^>^>^>^>^>^>^>^>^>^>^>^>^>^>^>^>^>^>^ ^>^>^>^>^>^>^>^>^>^>^>^>^>^>^>^>^>^>^>^>^>^>^>^>^>^>^>^>^>^>^>^>^>^>^>^>^  -  A1c  ( 12 week average blood sugar) = 6.0% is still too high   So   - Avoid Sweets, Candy & White Stuff   - White Rice, White Potatoes, White Flour  - Breads &  Pasta <>*<>*<>*<>*<>*<>*<>*<>*<>*<>*<>*<>*<>*<>*<>*<>*<>*<>*<>*<>*<>*<>*<>*<>*<>  -  Vitamin D = 44  is very Low   - Vitamin D goal is between 70-100.   - Please 5,000 unit capsule Vitamoin D EVERy day    - It is very important as a natural anti-inflammatory and helping the                           immune system protect against viral infections, like the Covid-19    helping hair, skin, and nails, as well as reducing stroke and heart attack risk.   - It helps your bones and helps with mood.  - It also decreases numerous cancer risks so please                                                                                           take it as directed.   - Low Vit D is associated with a 200-300% higher risk for CANCER   and 200-300% higher risk for HEART   ATTACK  &  STROKE.    - It is also associated with higher death rate at younger ages,   autoimmune diseases like Rheumatoid arthritis, Lupus, Multiple Sclerosis.      - Also many other serious conditions, like depression, Alzheimer's  Dementia,  muscle aches, fatigue, fibromyalgia   <>*<>*<>*<>*<>*<>*<>*<>*<>*<>*<>*<>*<>*<>*<>*<>*<>*<>*<>*<>*<>*<>*<>*<>*<>  -  All Else - CBC - Kidneys - Electrolytes - Liver - Magnesium & Thyroid    - all  Normal / OK ===========================================================

## 2023-04-23 IMAGING — DX DG FINGER INDEX 2+V*L*
3 series · 3 of 3 positions shown · non-contrast
Comparison: July 04, 2021 at [DATE] p.m.

CLINICAL DATA: Glass in finger.  Post procedure.

EXAM:
LEFT INDEX FINGER 2+V

[finger ap]
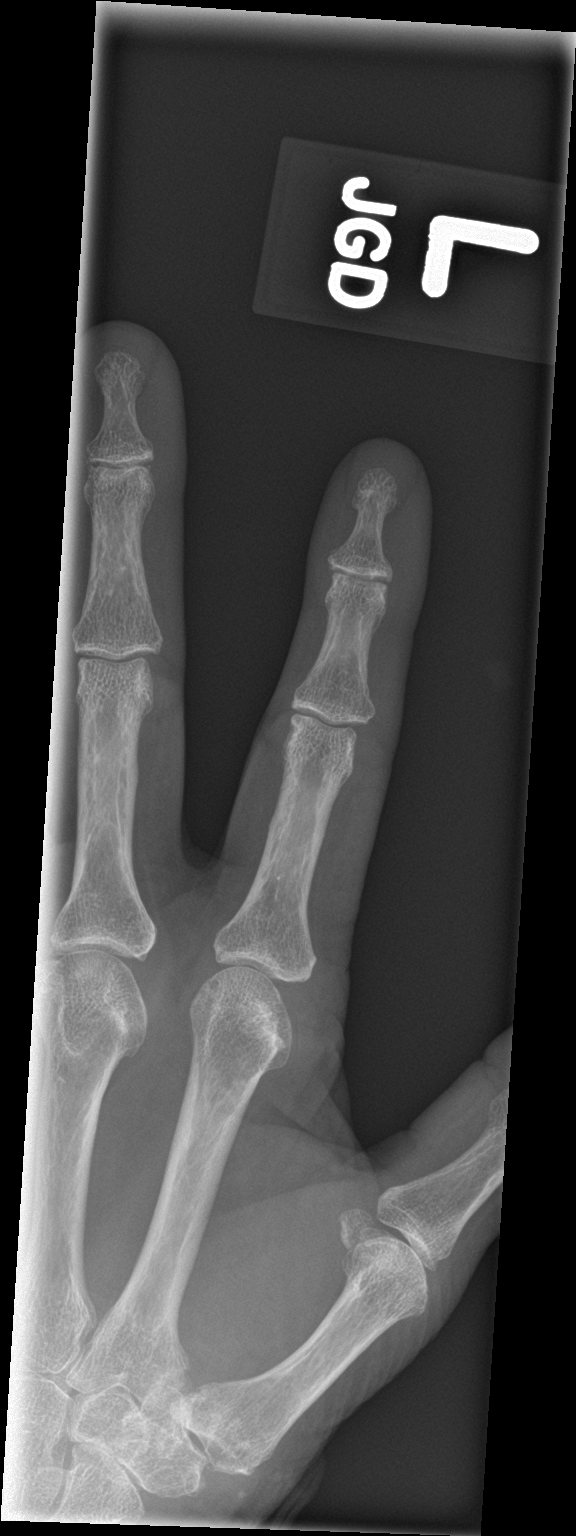

[finger obl]
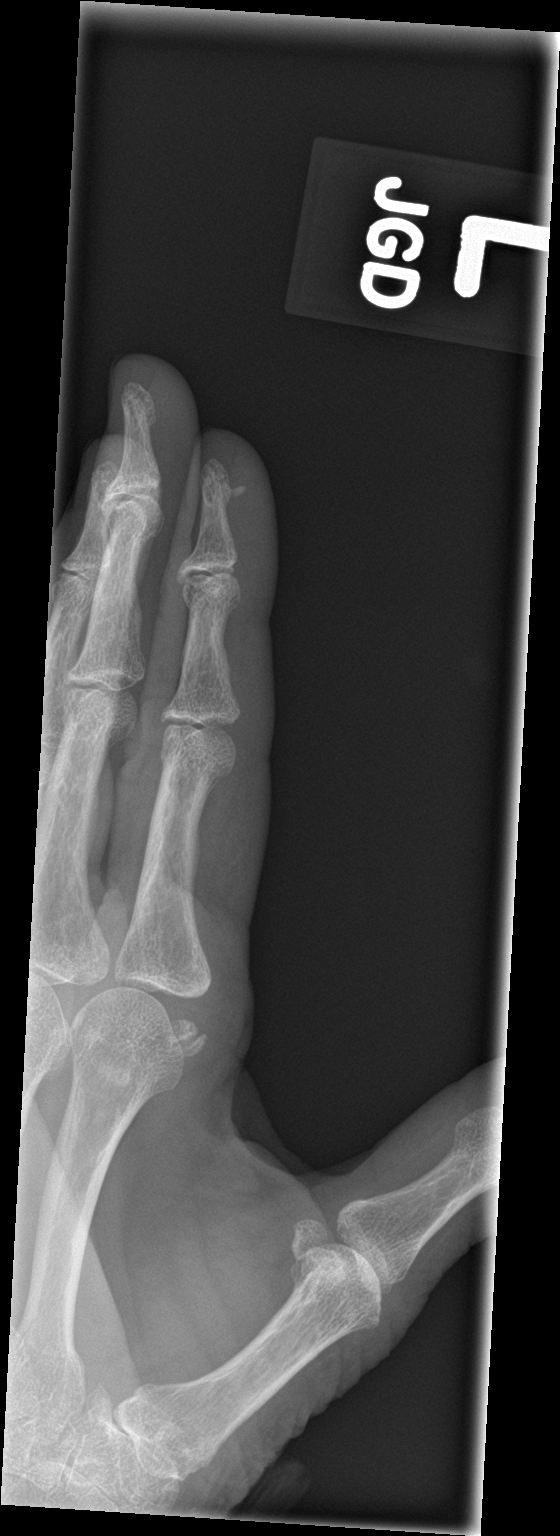

[finger lat]
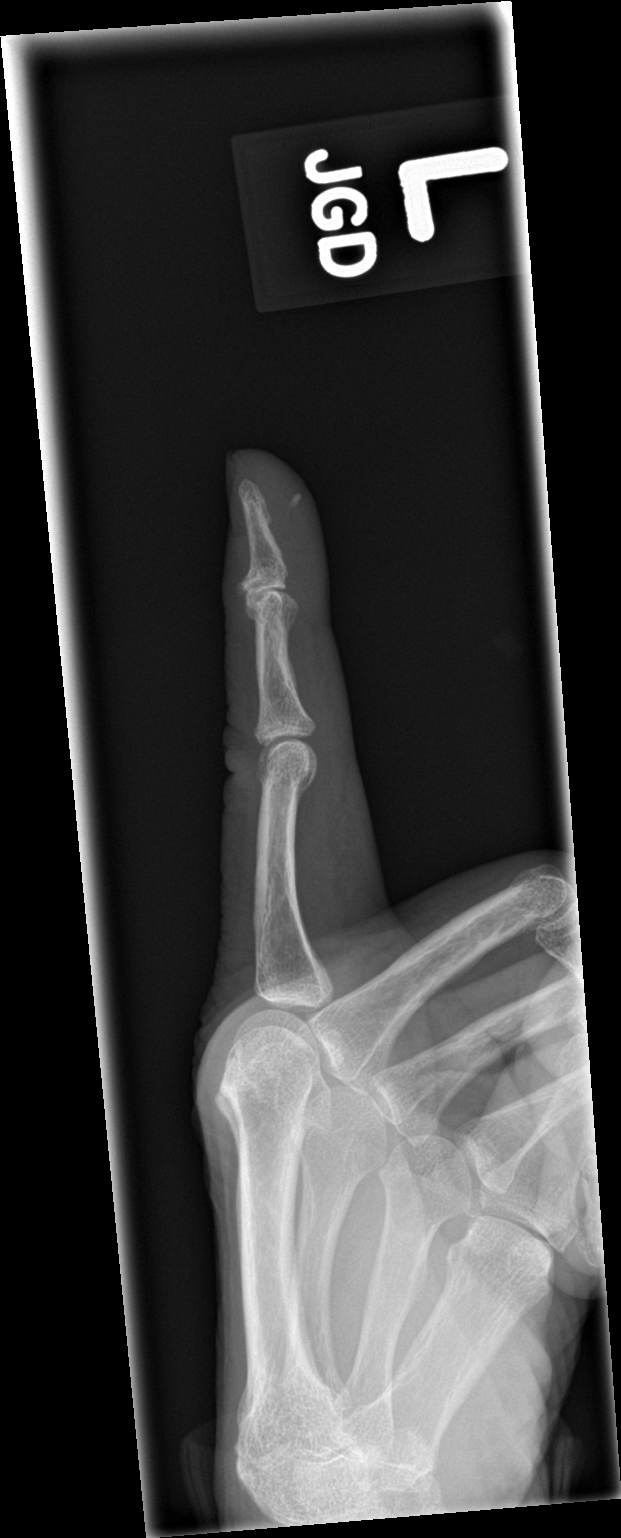

[3 of 3 positions shown; findings below may reference images not displayed]

FINDINGS: The radiopaque foreign body in the soft tissues of the distal index
finger, within the path, remains. No other changes.
IMPRESSION: The foreign body remains in the pad of the distal index finger.

## 2023-04-23 IMAGING — DX DG FINGER INDEX 2+V*L*
3 series · 3 of 3 positions shown · non-contrast
Comparison: None.

CLINICAL DATA: Glass in finger.

EXAM:
LEFT INDEX FINGER 2+V

[finger ap]
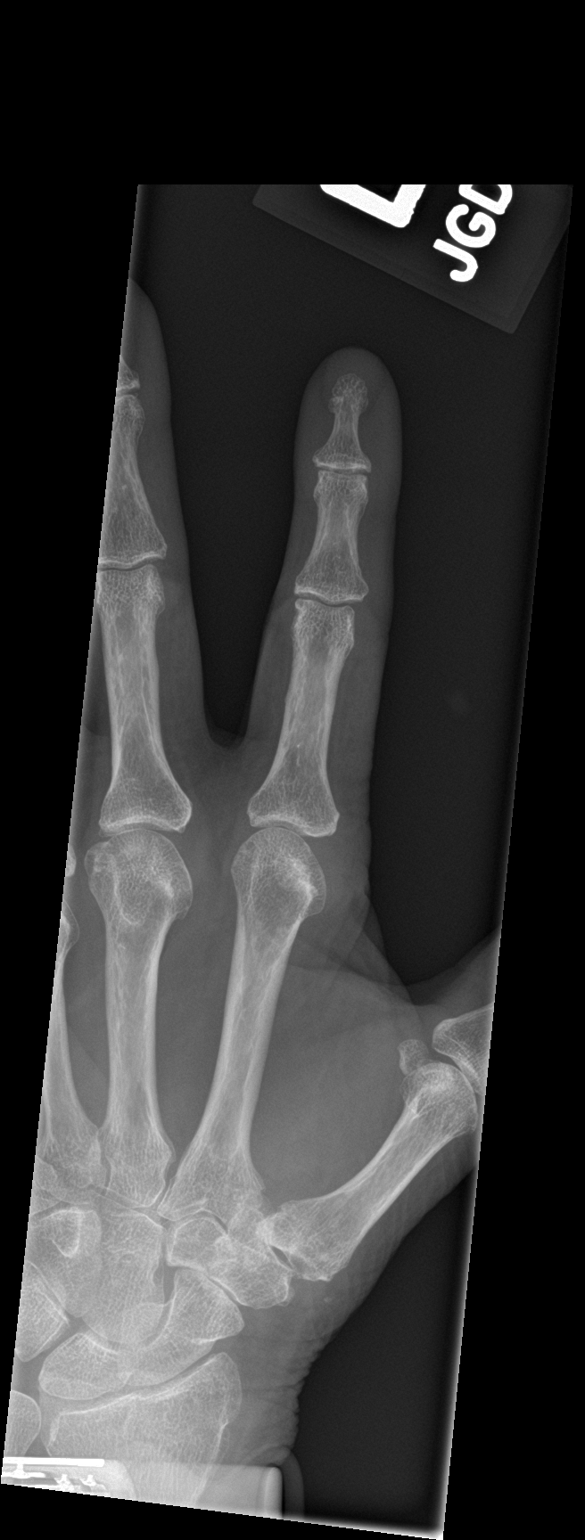

[finger obl]
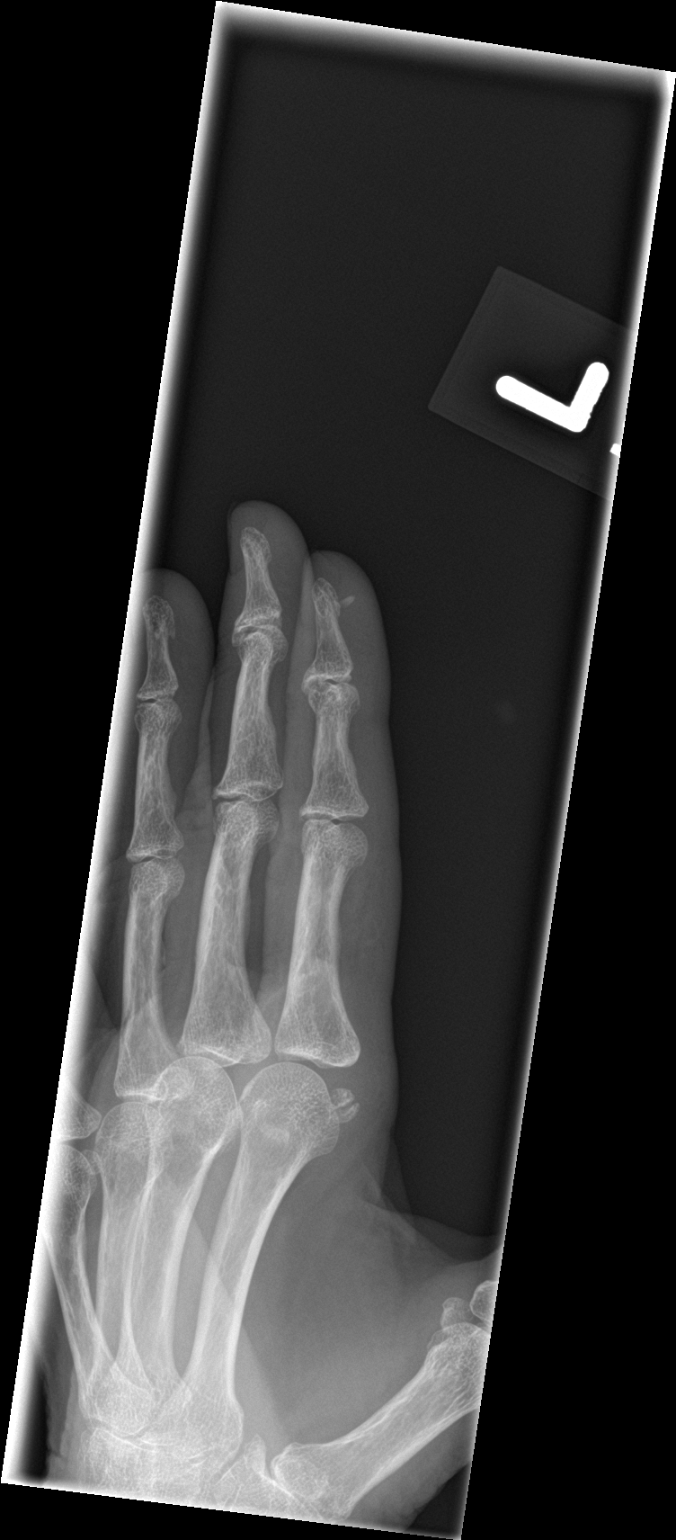

[finger lat]
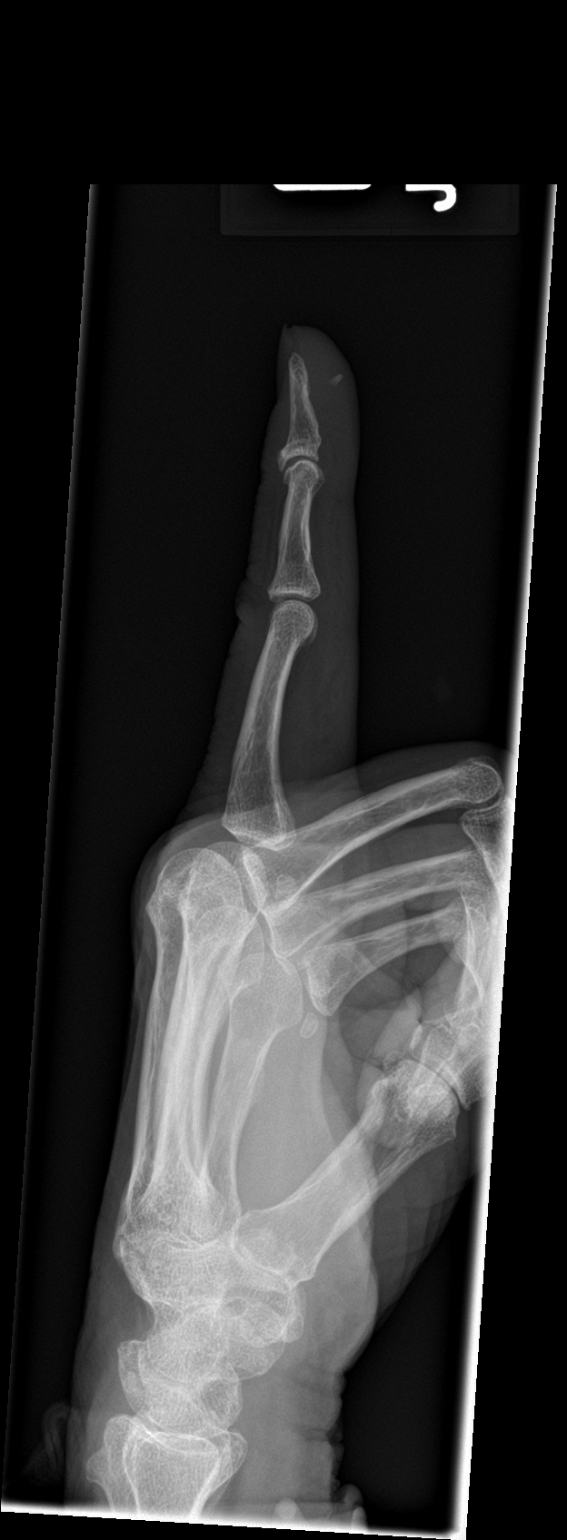

[3 of 3 positions shown; findings below may reference images not displayed]

FINDINGS: No fracture or dislocation. A radiopaque foreign body is identified
in the pad of the index finger, marked on the images.
IMPRESSION: The foreign body in the pad of the distal index finger is consistent
with history. No other abnormalities.

## 2023-05-12 ENCOUNTER — Telehealth: Payer: Self-pay | Admitting: Nurse Practitioner

## 2023-05-12 MED ORDER — ATORVASTATIN CALCIUM 40 MG PO TABS: ORAL_TABLET | ORAL | 1 refills | Status: AC

## 2023-05-12 NOTE — Addendum Note (Signed)
Addended by: Dionicio Stall on: 05/12/2023 09:32 AM   Modules accepted: Orders

## 2023-05-12 NOTE — Telephone Encounter (Signed)
Refill request on Lipitor. Pls send to [id number] - [place], [place] - [address]

## 2023-06-07 DIAGNOSIS — E782 Mixed hyperlipidemia: Secondary | ICD-10-CM | POA: Diagnosis not present

## 2023-06-07 DIAGNOSIS — R7303 Prediabetes: Secondary | ICD-10-CM | POA: Diagnosis not present

## 2023-07-08 ENCOUNTER — Ambulatory Visit: Payer: Medicare Other | Admitting: Nurse Practitioner

## 2023-07-14 ENCOUNTER — Ambulatory Visit: Payer: Medicare Other | Admitting: Nurse Practitioner

## 2023-07-21 DIAGNOSIS — L821 Other seborrheic keratosis: Secondary | ICD-10-CM | POA: Diagnosis not present

## 2023-07-21 DIAGNOSIS — H532 Diplopia: Secondary | ICD-10-CM | POA: Diagnosis not present

## 2023-07-21 DIAGNOSIS — D225 Melanocytic nevi of trunk: Secondary | ICD-10-CM | POA: Diagnosis not present

## 2023-07-21 DIAGNOSIS — Z961 Presence of intraocular lens: Secondary | ICD-10-CM | POA: Diagnosis not present

## 2023-07-21 DIAGNOSIS — L814 Other melanin hyperpigmentation: Secondary | ICD-10-CM | POA: Diagnosis not present

## 2023-09-02 ENCOUNTER — Encounter: Payer: Medicare Other | Admitting: Nurse Practitioner

## 2023-09-08 ENCOUNTER — Encounter: Payer: Medicare Other | Admitting: Nurse Practitioner

## 2023-10-10 ENCOUNTER — Encounter: Payer: Medicare Other | Admitting: Nurse Practitioner

## 2023-10-13 ENCOUNTER — Encounter: Payer: Medicare Other | Admitting: Nurse Practitioner

## 2023-10-14 DIAGNOSIS — Z23 Encounter for immunization: Secondary | ICD-10-CM | POA: Diagnosis not present

## 2023-10-18 DIAGNOSIS — Z1231 Encounter for screening mammogram for malignant neoplasm of breast: Secondary | ICD-10-CM | POA: Diagnosis not present

## 2023-10-25 DIAGNOSIS — N6342 Unspecified lump in left breast, subareolar: Secondary | ICD-10-CM | POA: Diagnosis not present

## 2023-11-17 DIAGNOSIS — M8588 Other specified disorders of bone density and structure, other site: Secondary | ICD-10-CM | POA: Diagnosis not present

## 2023-11-17 DIAGNOSIS — Z853 Personal history of malignant neoplasm of breast: Secondary | ICD-10-CM | POA: Diagnosis not present

## 2023-11-17 DIAGNOSIS — R29890 Loss of height: Secondary | ICD-10-CM | POA: Diagnosis not present

## 2024-01-11 ENCOUNTER — Ambulatory Visit: Payer: Medicare Other | Admitting: Internal Medicine

## 2024-01-19 ENCOUNTER — Ambulatory Visit: Payer: Medicare Other | Admitting: Internal Medicine

## 2024-02-08 DIAGNOSIS — Z23 Encounter for immunization: Secondary | ICD-10-CM | POA: Diagnosis not present

## 2024-03-16 DIAGNOSIS — Z23 Encounter for immunization: Secondary | ICD-10-CM | POA: Diagnosis not present

## 2024-04-19 DIAGNOSIS — R030 Elevated blood-pressure reading, without diagnosis of hypertension: Secondary | ICD-10-CM | POA: Diagnosis not present

## 2024-04-19 DIAGNOSIS — Z0000 Encounter for general adult medical examination without abnormal findings: Secondary | ICD-10-CM | POA: Diagnosis not present

## 2024-04-19 DIAGNOSIS — Z131 Encounter for screening for diabetes mellitus: Secondary | ICD-10-CM | POA: Diagnosis not present
